# Patient Record
Sex: Male | Born: 1950 | Race: White | Hispanic: No | Marital: Married | Smoking: Never smoker
Health system: Southern US, Community
[De-identification: ages and names within clinical notes are randomized; demographics above are authoritative.]

## PROBLEM LIST (undated history)

## (undated) DIAGNOSIS — G4730 Sleep apnea, unspecified: Secondary | ICD-10-CM

## (undated) DIAGNOSIS — J309 Allergic rhinitis, unspecified: Secondary | ICD-10-CM

## (undated) DIAGNOSIS — E785 Hyperlipidemia, unspecified: Secondary | ICD-10-CM

## (undated) DIAGNOSIS — D369 Benign neoplasm, unspecified site: Secondary | ICD-10-CM

## (undated) DIAGNOSIS — N35919 Unspecified urethral stricture, male, unspecified site: Secondary | ICD-10-CM

## (undated) DIAGNOSIS — K219 Gastro-esophageal reflux disease without esophagitis: Secondary | ICD-10-CM

## (undated) DIAGNOSIS — I10 Essential (primary) hypertension: Secondary | ICD-10-CM

## (undated) DIAGNOSIS — E349 Endocrine disorder, unspecified: Secondary | ICD-10-CM

## (undated) DIAGNOSIS — E559 Vitamin D deficiency, unspecified: Secondary | ICD-10-CM

## (undated) HISTORY — PX: OTHER SURGICAL HISTORY: SHX169

## (undated) HISTORY — PX: URETHRAL STRICTURE DILATATION: SHX477

---

## 2007-03-21 ENCOUNTER — Ambulatory Visit: Payer: Self-pay | Admitting: Gastroenterology

## 2007-11-22 ENCOUNTER — Ambulatory Visit: Payer: Self-pay | Admitting: Gastroenterology

## 2009-02-18 ENCOUNTER — Ambulatory Visit: Payer: Self-pay | Admitting: Gastroenterology

## 2010-03-31 ENCOUNTER — Ambulatory Visit: Payer: Self-pay | Admitting: Otolaryngology

## 2011-05-05 ENCOUNTER — Ambulatory Visit: Payer: Self-pay | Admitting: Gastroenterology

## 2011-05-08 ENCOUNTER — Emergency Department: Payer: Self-pay | Admitting: Unknown Physician Specialty

## 2011-05-22 ENCOUNTER — Emergency Department: Payer: Self-pay | Admitting: Unknown Physician Specialty

## 2012-04-19 ENCOUNTER — Other Ambulatory Visit: Payer: Self-pay | Admitting: Gastroenterology

## 2012-04-19 LAB — CLOSTRIDIUM DIFFICILE BY PCR

## 2012-06-27 ENCOUNTER — Ambulatory Visit: Payer: Self-pay | Admitting: Gastroenterology

## 2012-12-08 IMAGING — CT CT HEAD WITHOUT CONTRAST
2 series · 16 of 30 positions shown, 20 images · non-contrast
Comparison: none

REASON FOR EXAM: head injury
COMMENTS:

[Series 2: without · axial · non-contrast · 0.44mm/px · z∈[+333,+463]mm · 13 of 32 slices shown, 17 images]
[im 3/32  brain]
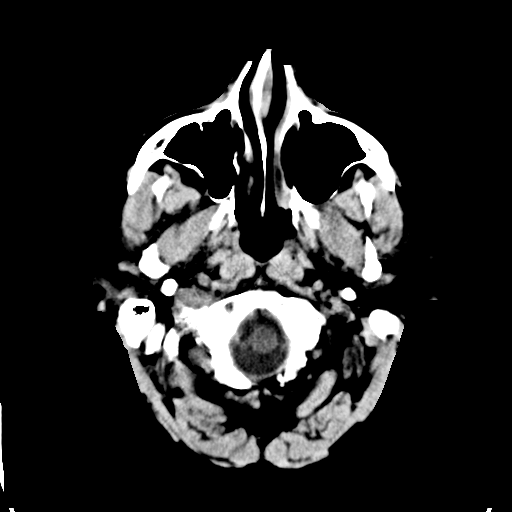
[im 3/32  bone]
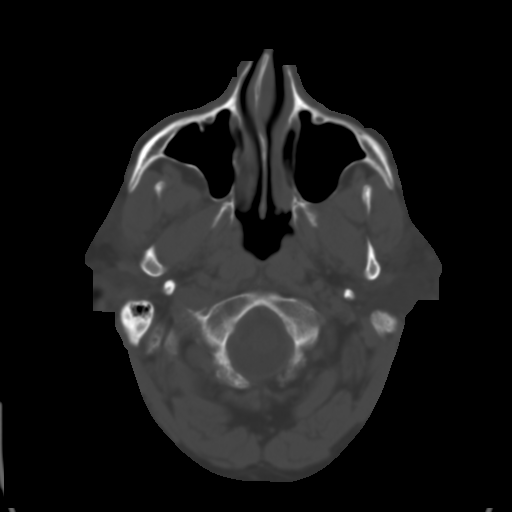
[im 5/32  brain]
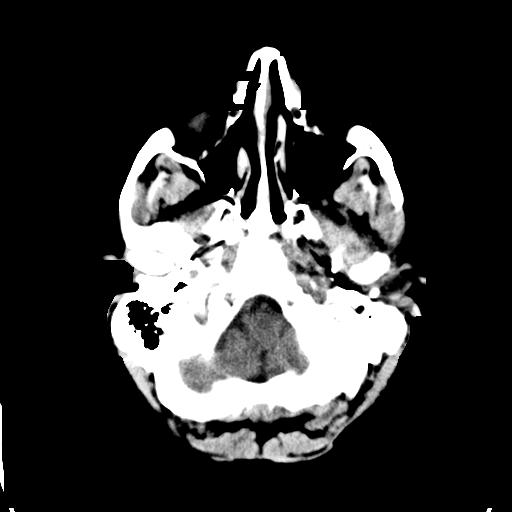
[im 7/32  brain]
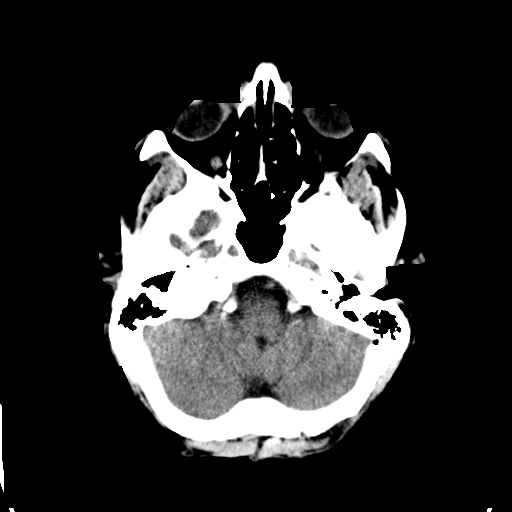
[im 9/32  brain]
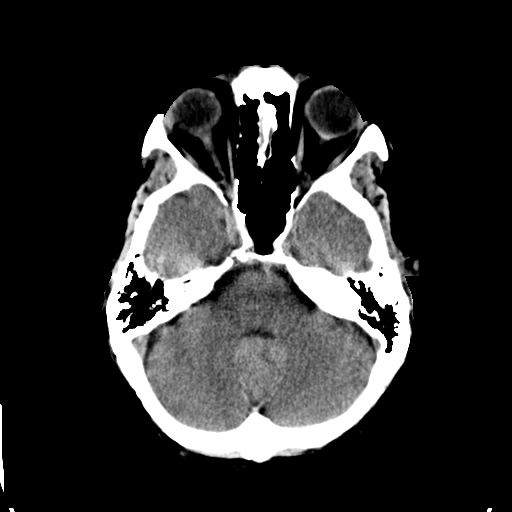
[im 12/32  brain]
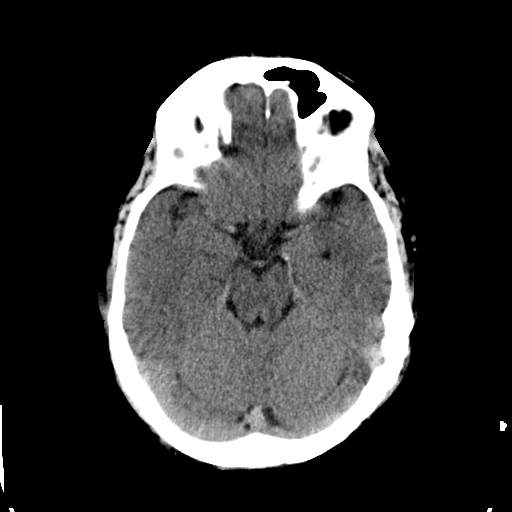
[im 12/32  bone]
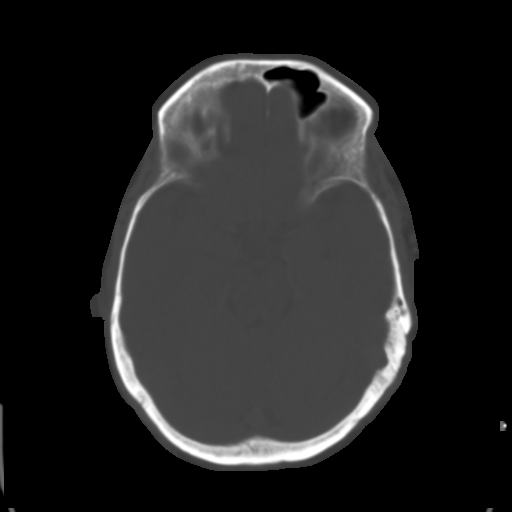
[im 14/32  brain]
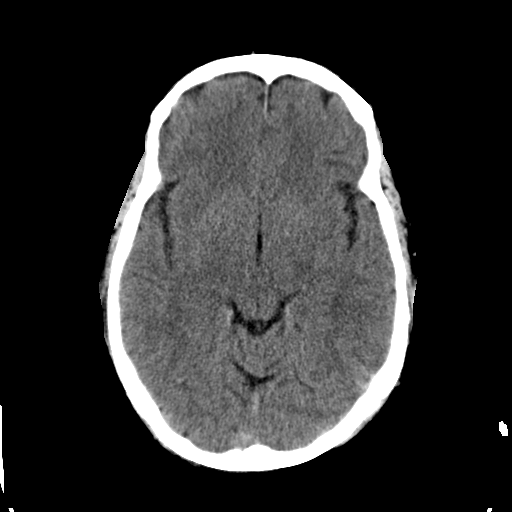
[im 16/32  brain]
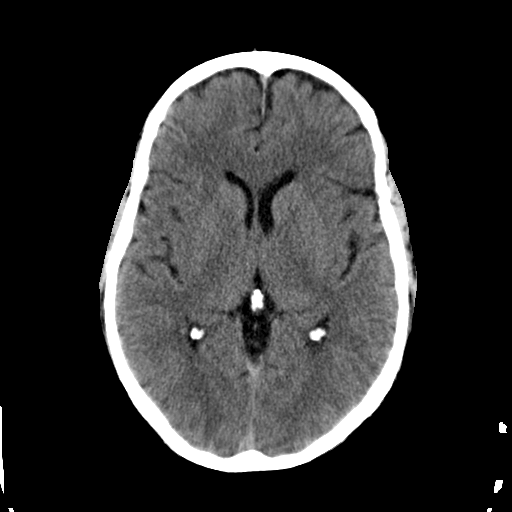
[im 18/32  brain]
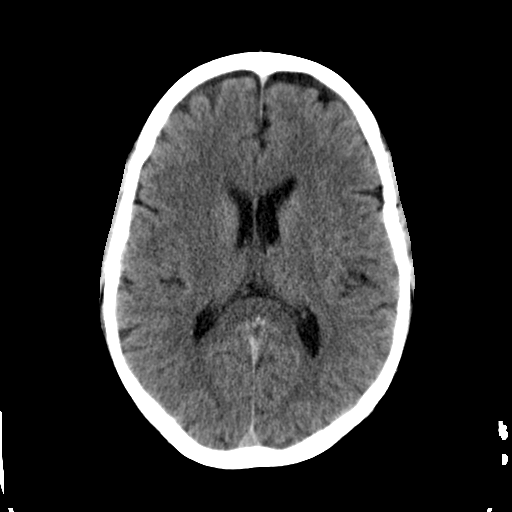
[im 20/32  brain]
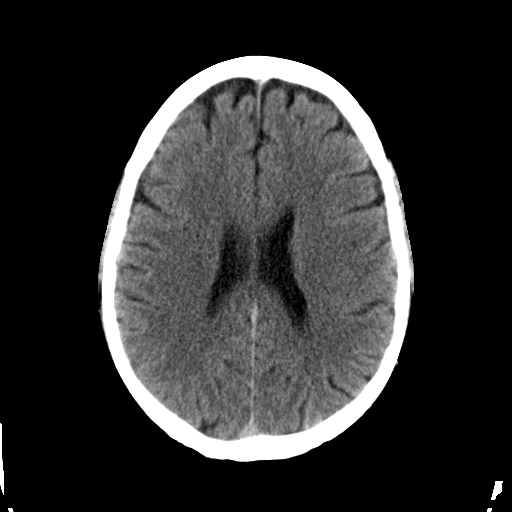
[im 20/32  bone]
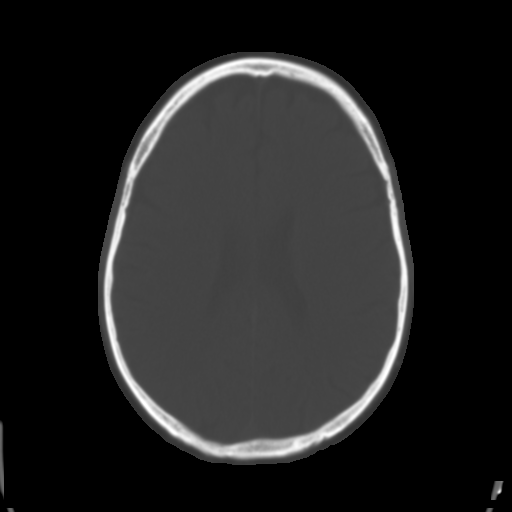
[im 23/32  brain]
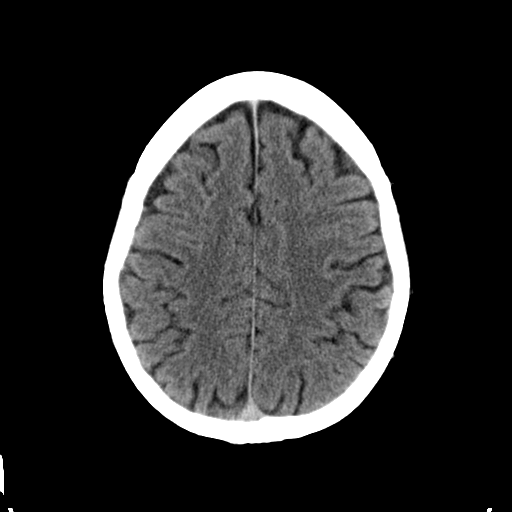
[im 25/32  brain]
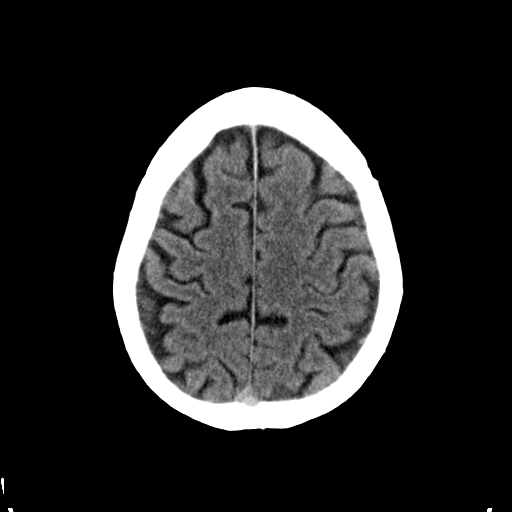
[im 27/32  brain]
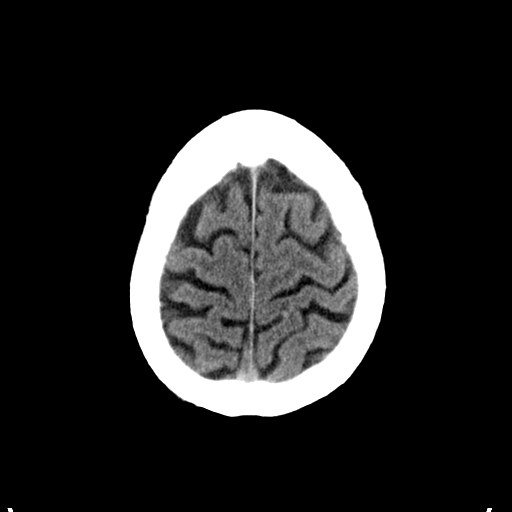
[im 29/32  brain]
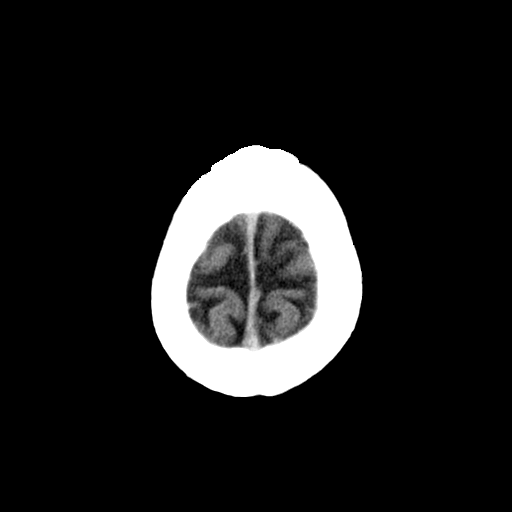
[im 29/32  bone]
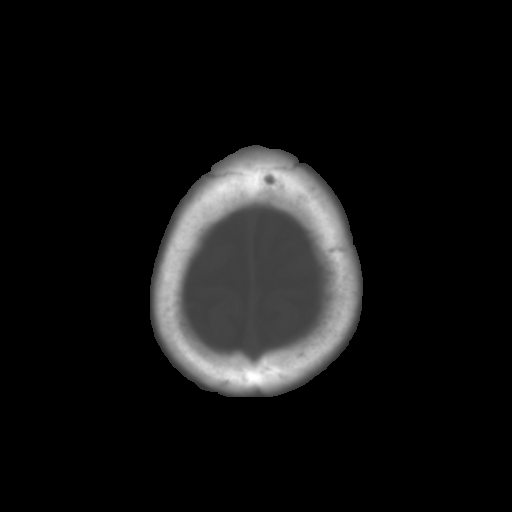

[Series 3: bone · axial · 0.44mm/px · z∈[+333,+378]mm · 3 of 32 slices shown]
[im 3/32  bone]
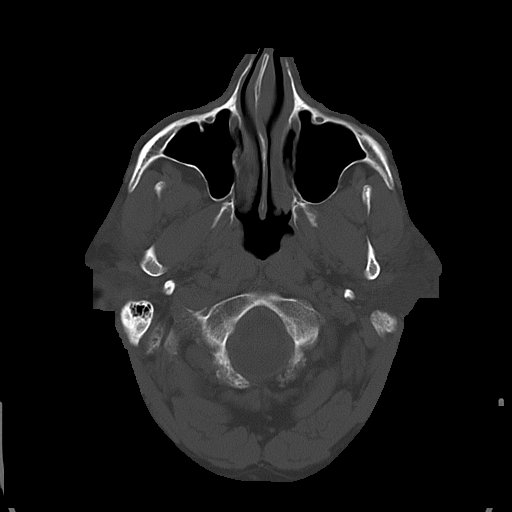
[im 7/32  bone]
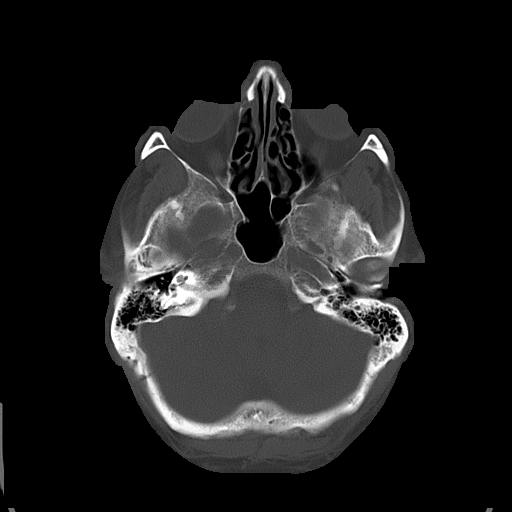
[im 12/32  bone]
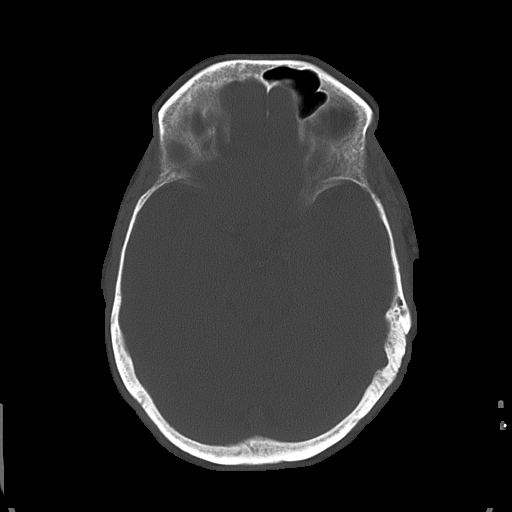

[16 of 30 positions shown; findings below may reference images not displayed]

PROCEDURE:     CT  - CT HEAD WITHOUT CONTRAST  - May 08, 2011  [DATE]

RESULT:     Noncontrast emergent CT of the brain is performed in the
standard fashion. There is no previous exam for comparison.

The ventricles and sulci are normal. There is no hemorrhage. There is no
focal mass, mass-effect or midline shift. There is no evidence of edema or
territorial infarct. The bone windows demonstrate normal aeration of the
paranasal sinuses and mastoid air cells. There is no skull fracture
demonstrated.
IMPRESSION: 1. No acute intracranial abnormality.

## 2015-02-11 ENCOUNTER — Other Ambulatory Visit: Payer: Self-pay | Admitting: Gastroenterology

## 2015-02-11 DIAGNOSIS — R1032 Left lower quadrant pain: Secondary | ICD-10-CM

## 2015-02-11 DIAGNOSIS — R197 Diarrhea, unspecified: Secondary | ICD-10-CM

## 2015-02-11 DIAGNOSIS — R1031 Right lower quadrant pain: Secondary | ICD-10-CM

## 2015-02-17 ENCOUNTER — Ambulatory Visit
Admission: RE | Admit: 2015-02-17 | Discharge: 2015-02-17 | Disposition: A | Discharge status: Home / Self Care | Payer: 59 | Source: Ambulatory Visit | Attending: Gastroenterology | Admitting: Gastroenterology

## 2015-02-17 DIAGNOSIS — K580 Irritable bowel syndrome with diarrhea: Secondary | ICD-10-CM | POA: Insufficient documentation

## 2015-02-17 DIAGNOSIS — R1031 Right lower quadrant pain: Secondary | ICD-10-CM | POA: Insufficient documentation

## 2015-02-17 DIAGNOSIS — Z86010 Personal history of colonic polyps: Secondary | ICD-10-CM | POA: Insufficient documentation

## 2015-02-17 DIAGNOSIS — R197 Diarrhea, unspecified: Secondary | ICD-10-CM

## 2015-02-17 DIAGNOSIS — R1032 Left lower quadrant pain: Secondary | ICD-10-CM | POA: Diagnosis present

## 2015-02-17 HISTORY — DX: Essential (primary) hypertension: I10

## 2015-02-17 MED ORDER — IOHEXOL 300 MG/ML  SOLN
100.0000 mL | Freq: Once | INTRAMUSCULAR | Status: AC | PRN
  Administered 2015-02-17: 100 mL via INTRAVENOUS

## 2015-03-17 ENCOUNTER — Encounter: Payer: Self-pay | Admitting: *Deleted

## 2015-03-18 ENCOUNTER — Encounter
Admission: RE | Disposition: A | Discharge status: Home Health | Payer: Self-pay | Source: Ambulatory Visit | Attending: Gastroenterology

## 2015-03-18 ENCOUNTER — Ambulatory Visit: Payer: 59 | Admitting: Anesthesiology

## 2015-03-18 ENCOUNTER — Ambulatory Visit
Admission: RE | Admit: 2015-03-18 | Discharge: 2015-03-18 | Disposition: A | Discharge status: Home Health | Payer: 59 | Source: Ambulatory Visit | Attending: Gastroenterology | Admitting: Gastroenterology

## 2015-03-18 ENCOUNTER — Encounter: Payer: Self-pay | Admitting: *Deleted

## 2015-03-18 DIAGNOSIS — G4730 Sleep apnea, unspecified: Secondary | ICD-10-CM | POA: Insufficient documentation

## 2015-03-18 DIAGNOSIS — E291 Testicular hypofunction: Secondary | ICD-10-CM | POA: Insufficient documentation

## 2015-03-18 DIAGNOSIS — D120 Benign neoplasm of cecum: Secondary | ICD-10-CM | POA: Diagnosis not present

## 2015-03-18 DIAGNOSIS — R197 Diarrhea, unspecified: Secondary | ICD-10-CM | POA: Diagnosis not present

## 2015-03-18 DIAGNOSIS — K648 Other hemorrhoids: Secondary | ICD-10-CM | POA: Diagnosis not present

## 2015-03-18 DIAGNOSIS — K529 Noninfective gastroenteritis and colitis, unspecified: Secondary | ICD-10-CM | POA: Diagnosis not present

## 2015-03-18 DIAGNOSIS — Z881 Allergy status to other antibiotic agents status: Secondary | ICD-10-CM | POA: Diagnosis not present

## 2015-03-18 DIAGNOSIS — K5730 Diverticulosis of large intestine without perforation or abscess without bleeding: Secondary | ICD-10-CM | POA: Diagnosis not present

## 2015-03-18 DIAGNOSIS — Z79899 Other long term (current) drug therapy: Secondary | ICD-10-CM | POA: Diagnosis not present

## 2015-03-18 DIAGNOSIS — K219 Gastro-esophageal reflux disease without esophagitis: Secondary | ICD-10-CM | POA: Diagnosis not present

## 2015-03-18 DIAGNOSIS — I10 Essential (primary) hypertension: Secondary | ICD-10-CM | POA: Insufficient documentation

## 2015-03-18 DIAGNOSIS — E559 Vitamin D deficiency, unspecified: Secondary | ICD-10-CM | POA: Insufficient documentation

## 2015-03-18 DIAGNOSIS — R1032 Left lower quadrant pain: Secondary | ICD-10-CM | POA: Diagnosis present

## 2015-03-18 DIAGNOSIS — E785 Hyperlipidemia, unspecified: Secondary | ICD-10-CM | POA: Insufficient documentation

## 2015-03-18 HISTORY — DX: Unspecified urethral stricture, male, unspecified site: N35.919

## 2015-03-18 HISTORY — DX: Sleep apnea, unspecified: G47.30

## 2015-03-18 HISTORY — DX: Benign neoplasm, unspecified site: D36.9

## 2015-03-18 HISTORY — DX: Endocrine disorder, unspecified: E34.9

## 2015-03-18 HISTORY — DX: Gastro-esophageal reflux disease without esophagitis: K21.9

## 2015-03-18 HISTORY — PX: COLONOSCOPY WITH PROPOFOL: SHX5780

## 2015-03-18 HISTORY — DX: Vitamin D deficiency, unspecified: E55.9

## 2015-03-18 HISTORY — DX: Hyperlipidemia, unspecified: E78.5

## 2015-03-18 HISTORY — DX: Allergic rhinitis, unspecified: J30.9

## 2015-03-18 SURGERY — COLONOSCOPY WITH PROPOFOL
Anesthesia: General

## 2015-03-18 MED ORDER — PROPOFOL INFUSION 10 MG/ML OPTIME
INTRAVENOUS | Status: DC | PRN
Start: 2015-03-18 — End: 2015-03-18
  Administered 2015-03-18: 140 ug/kg/min via INTRAVENOUS

## 2015-03-18 MED ORDER — SODIUM CHLORIDE 0.9 % IV SOLN
INTRAVENOUS | Status: DC
Start: 2015-03-18 — End: 2015-03-18
  Administered 2015-03-18: 15:00:00 via INTRAVENOUS

## 2015-03-18 MED ORDER — MIDAZOLAM HCL 2 MG/2ML IJ SOLN
INTRAMUSCULAR | Status: DC | PRN
  Administered 2015-03-18: 1 mg via INTRAVENOUS

## 2015-03-18 MED ORDER — FENTANYL CITRATE (PF) 100 MCG/2ML IJ SOLN
INTRAMUSCULAR | Status: DC | PRN
  Administered 2015-03-18: 50 ug via INTRAVENOUS

## 2015-03-18 NOTE — Anesthesia Preprocedure Evaluation (Signed)
Anesthesia Evaluation  Patient identified by MRN, date of birth, ID band Patient awake    Reviewed: Allergy & Precautions, H&P , NPO status , Patient's Chart, lab work & pertinent test results, reviewed documented beta blocker date and time   History of Anesthesia Complications Negative for: history of anesthetic complications  Airway Mallampati: III  TM Distance: >3 FB Neck ROM: full    Dental no notable dental hx. (+) Teeth Intact   Pulmonary sleep apnea (resolved after 45 pound weight loss) , neg COPDneg recent URI,  breath sounds clear to auscultation  Pulmonary exam normal       Cardiovascular Exercise Tolerance: Good hypertension, - angina- CAD, - Past MI, - Cardiac Stents and - CABG Normal cardiovascular exam- dysrhythmias - Valvular Problems/MurmursRhythm:regular Rate:Normal     Neuro/Psych negative neurological ROS  negative psych ROS   GI/Hepatic Neg liver ROS, GERD- (resolved after 45 pound weight loss)  ,  Endo/Other  negative endocrine ROS  Renal/GU negative Renal ROS  negative genitourinary   Musculoskeletal   Abdominal   Peds  Hematology negative hematology ROS (+)   Anesthesia Other Findings Past Medical History:   Hypertension                                                 GERD (gastroesophageal reflux disease)                       Hyperlipidemia                                               Benign neoplasm                                              Benign neoplasm                                              Urethral stricture                                           Sleep apnea                                                  Allergic rhinitis                                            Vitamin D deficiency                                         Hypotestosteronism  Reproductive/Obstetrics negative OB ROS                              Anesthesia Physical Anesthesia Plan  ASA: II  Anesthesia Plan: General   Post-op Pain Management:    Induction:   Airway Management Planned:   Additional Equipment:   Intra-op Plan:   Post-operative Plan:   Informed Consent: I have reviewed the patients History and Physical, chart, labs and discussed the procedure including the risks, benefits and alternatives for the proposed anesthesia with the patient or authorized representative who has indicated his/her understanding and acceptance.   Dental Advisory Given  Plan Discussed with: Anesthesiologist, CRNA and Surgeon  Anesthesia Plan Comments:         Anesthesia Quick Evaluation

## 2015-03-18 NOTE — H&P (Signed)
Outpatient short stay form Pre-procedure 03/18/2015 3:09 PM Lollie Sails MD  Primary Physician: Dr. Juluis Pitch  Reason for visit:  Lower abdominal pain and change of bowel habits with diarrhea  History of present illness:  Patient is a 64 year old male presenting several months of change of bowel habits with diarrhea. Stool studies as an outpatient have been uninformative. He does not have difficulty with Pasta or milk products. He tolerated his prep well. He takes no aspirin or blood thinners.    Current facility-administered medications:  .  0.9 %  sodium chloride infusion, , Intravenous, Continuous, Lollie Sails, MD  Prescriptions prior to admission  Medication Sig Dispense Refill Last Dose  . dicyclomine (BENTYL) 10 MG capsule Take 10 mg by mouth 4 (four) times daily -  before meals and at bedtime.   Past Week at Unknown time  . hyoscyamine (LEVSIN, ANASPAZ) 0.125 MG tablet Take 0.125 mg by mouth every 4 (four) hours as needed.   Past Week at Unknown time  . metoprolol succinate (TOPROL-XL) 50 MG 24 hr tablet Take 50 mg by mouth daily. Take with or immediately following a meal.   03/17/2015 at Unknown time     Allergies  Allergen Reactions  . Amoxicillin Other (See Comments)  . Erythromycin      Past Medical History  Diagnosis Date  . Hypertension   . GERD (gastroesophageal reflux disease)   . Hyperlipidemia   . Benign neoplasm   . Benign neoplasm   . Urethral stricture   . Sleep apnea   . Allergic rhinitis   . Vitamin D deficiency   . Hypotestosteronism     Review of systems:      Physical Exam    Heart and lungs: Regular rate and rhythm without rub or gallop, lungs are bilaterally clear    HEENT: Normocephalic atraumatic eyes are anicteric    Other:     Pertinant exam for procedure: Soft nontender nondistended bowel sounds positive normoactive    Planned proceedures: Colonoscopy and indicated procedures. I have discussed the risks benefits  and complications of procedures to include not limited to bleeding, infection, perforation and the risk of sedation and the patient wishes to proceed.    Lollie Sails, MD Gastroenterology 03/18/2015  3:09 PM

## 2015-03-18 NOTE — Transfer of Care (Signed)
Immediate Anesthesia Transfer of Care Note  Patient: Mark Bowman  Procedure(s) Performed: Procedure(s): COLONOSCOPY WITH PROPOFOL (N/A)  Patient Location: PACU and Endoscopy Unit  Anesthesia Type:General  Level of Consciousness: patient cooperative and lethargic  Airway & Oxygen Therapy: Patient Spontanous Breathing and Patient connected to nasal cannula oxygen  Post-op Assessment: Report given to RN and Post -op Vital signs reviewed and stable  Post vital signs: Reviewed and stable  Last Vitals:  Filed Vitals:   03/18/15 1600  BP: 110/57  Pulse:   Temp: 36.4 C  Resp: 16    Complications: No apparent anesthesia complications

## 2015-03-19 ENCOUNTER — Encounter: Payer: Self-pay | Admitting: Gastroenterology

## 2015-03-19 NOTE — Progress Notes (Signed)
Pt to call office about rash under arm. Dr Kayleen Memos aware.

## 2015-03-20 NOTE — Anesthesia Postprocedure Evaluation (Signed)
  Anesthesia Post-op Note  Patient: Mark Bowman  Procedure(s) Performed: Procedure(s): COLONOSCOPY WITH PROPOFOL (N/A)  Anesthesia type:General  Patient location: PACU  Post pain: Pain level controlled  Post assessment: Post-op Vital signs reviewed, Patient's Cardiovascular Status Stable, Respiratory Function Stable, Patent Airway and No signs of Nausea or vomiting  Post vital signs: Reviewed and stable  Last Vitals:  Filed Vitals:   03/18/15 1630  BP: 114/96  Pulse: 48  Temp:   Resp: 15    Level of consciousness: awake, alert  and patient cooperative  Complications: No apparent anesthesia complications

## 2015-03-20 NOTE — Op Note (Addendum)
[facility] Gastroenterology Patient Name: Mark Bowman Procedure Date: 03/18/2015 3:06 PM MRN: [id number] Account #: [id number] Date of Birth: 23-Jul-1951 Admit Type: Outpatient Age: 64 Room: [facility] ENDO ROOM 3 Gender: Male Note Status: Supervisor Override Procedure:         Colonoscopy Indications:       Abdominal pain in the left lower quadrant, Change in bowel                     habits, Clinically significant diarrhea of unexplained                     origin Providers:         Lollie Sails, MD Referring MD:      [name], MD (Referring MD) Medicines:         Monitored Anesthesia Care Complications:     No immediate complications. Procedure:         Pre-Anesthesia Assessment:                    - ASA Grade Assessment: II - A patient with mild systemic                     disease.                    After obtaining informed consent, the colonoscope was                     passed under direct vision. Throughout the procedure, the                     patient's blood pressure, pulse, and oxygen saturations                     were monitored continuously. The Colonoscope was                     introduced through the anus and advanced to the the                     terminal ileum. The colonoscopy was performed without                     difficulty. The patient tolerated the procedure well. The                     quality of the bowel preparation was fair. Findings:      A 2 mm polyp was found at the appendiceal orifice. The polyp was       sessile. The polyp was removed with a cold biopsy forceps. Resection and       retrieval were complete.      A patchy area of minimal erythematous mucosa was found in the distal       sigmoid colon.      Multiple small to medium diverticula were found in the sigmoid colon, in       the descending colon, in the transverse colon and in the ascending colon.      Biopsies for histology were taken with a cold  forceps from the right       colon, left colon and rectum for evaluation of microscopic colitis.      Non-bleeding internal hemorrhoids were found during retroflexion. The       hemorrhoids were  small.      The digital rectal exam was normal.      The terminal ileum appeared normal. Impression:        - One 2 mm polyp at the appendiceal orifice. Resected and                     retrieved.                    - Erythematous mucosa in the distal sigmoid colon.                    - Diverticulosis in the sigmoid colon, in the descending                     colon, in the transverse colon and in the ascending colon.                    - Non-bleeding internal hemorrhoids.                    - Biopsies were taken with a cold forceps from the right                     colon, left colon and rectum for evaluation of microscopic                     colitis. Recommendation:    - Await pathology results.                    - Return to GI clinic in 3 weeks.                    Hardin Negus colon health one capsule daily. Procedure Code(s): --- Professional ---                    [id number], Colonoscopy, flexible; with biopsy, single or                     multiple Diagnosis Code(s): --- Professional ---                    211.3, Benign neoplasm of colon                    569.89, Other specified disorders of intestine                    455.0, Internal hemorrhoids without mention of complication                    789.04, Abdominal pain, left lower quadrant                    787.99, Other symptoms involving digestive system                    787.91, Diarrhea                    562.10, Diverticulosis of colon (without mention of                     hemorrhage) CPT copyright 2014 American [employer] Association. All rights reserved. The codes documented in this report are preliminary and upon coder review may  be revised to meet current compliance requirements. Lollie Sails, MD 03/18/2015 4:00:58 PM This  report has been signed electronically. Number of Addenda: 0 Note Initiated On: 03/18/2015 3:06 PM Scope Withdrawal Time: 0 hours 13 minutes 32 seconds  Total Procedure Duration: 0 hours 23 minutes 9 seconds       [facility]

## 2015-03-24 LAB — SURGICAL PATHOLOGY

## 2015-05-07 ENCOUNTER — Other Ambulatory Visit
Admission: RE | Admit: 2015-05-07 | Discharge: 2015-05-07 | Disposition: A | Discharge status: Home / Self Care | Payer: 59 | Source: Ambulatory Visit | Attending: Gastroenterology | Admitting: Gastroenterology

## 2015-05-09 LAB — CELIAC DISEASE PANEL
ENDOMYSIAL ANTIBODY IGA: NEGATIVE
IGA: 106 mg/dL (ref 61–437)

## 2015-05-14 ENCOUNTER — Encounter: Payer: Self-pay | Admitting: Gastroenterology

## 2016-09-19 IMAGING — CT CT ABD-PELV W/ CM
2 of 5 series · 16 of 46 positions shown, 18 images · IV contrast (omnipaque)
Comparison: 11/22/2007

CLINICAL DATA: Subsequent encounter for chronic diarrhea now with
worsening over the last 6 months.

EXAM:
CT ABDOMEN AND PELVIS WITH CONTRAST
TECHNIQUE: Multidetector CT imaging of the abdomen and pelvis was performed
using the standard protocol following bolus administration of
intravenous contrast.
CONTRAST:  100mL OMNIPAQUE IOHEXOL 300 MG/ML  SOLN

[Series 2: routine with · axial · 0.79mm/px · z∈[-920,-490]mm · 13 of 98 slices shown, 15 images]
[im 6/98  soft-tissue]
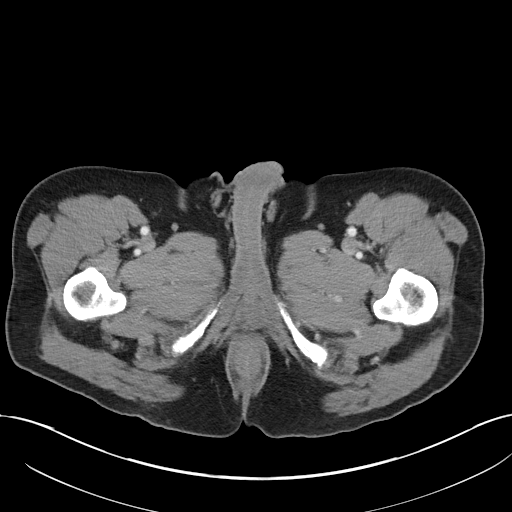
[im 6/98  bone]
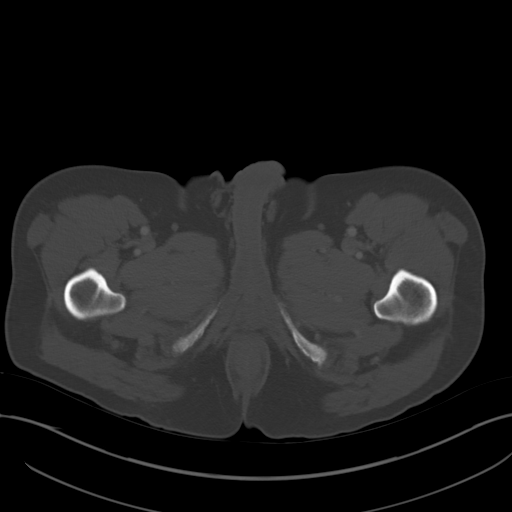
[im 16/98  soft-tissue]
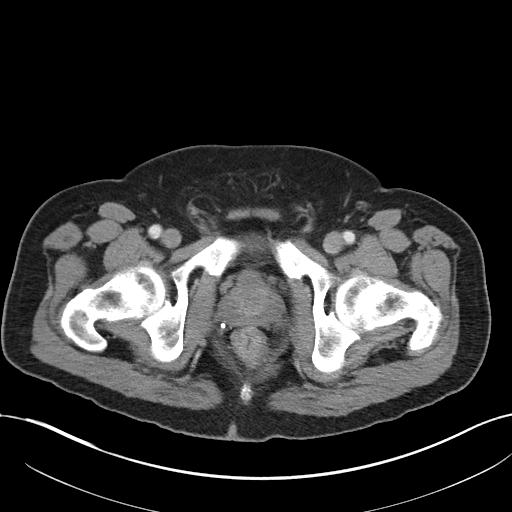
[im 21/98  soft-tissue]
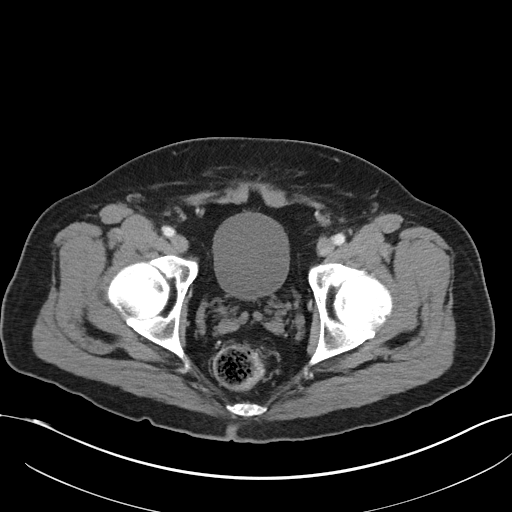
[im 26/98  soft-tissue]
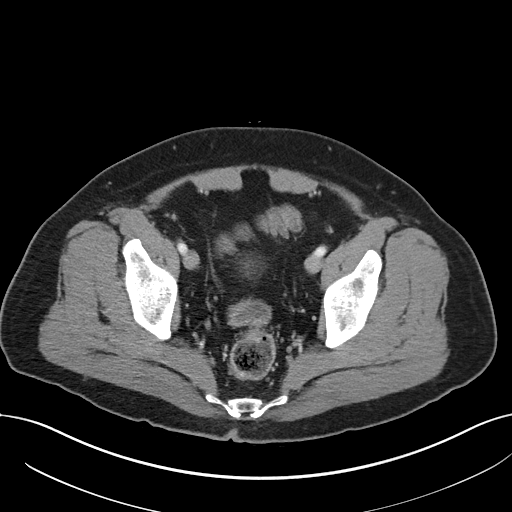
[im 36/98  soft-tissue]
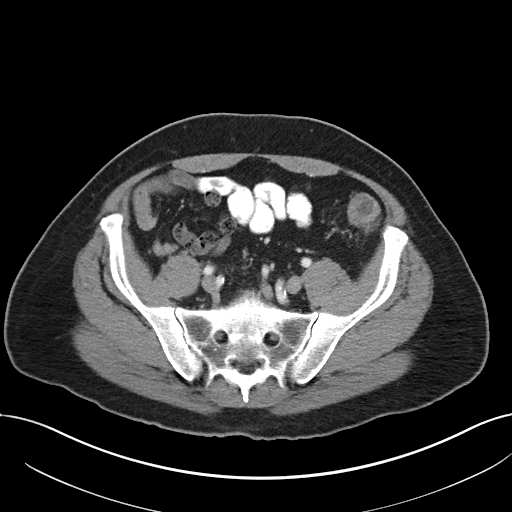
[im 41/98  soft-tissue]
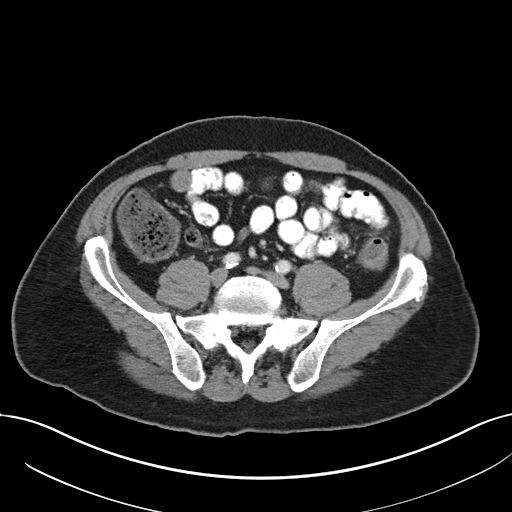
[im 52/98  soft-tissue]
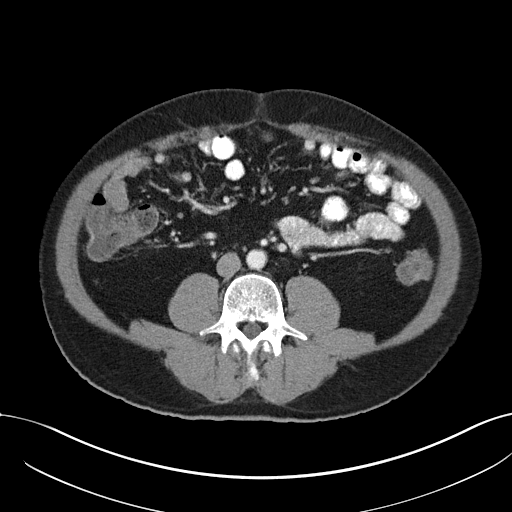
[im 57/98  soft-tissue]
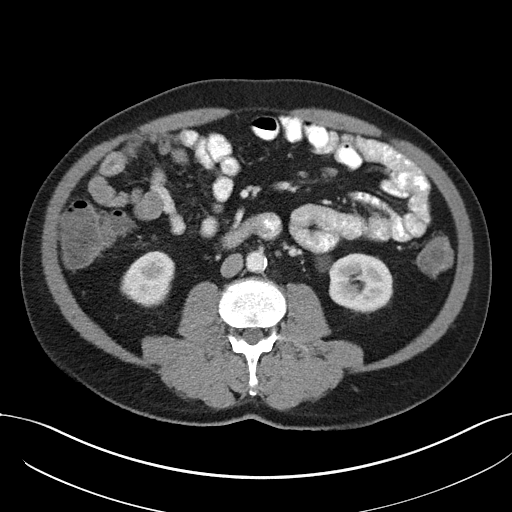
[im 62/98  soft-tissue]
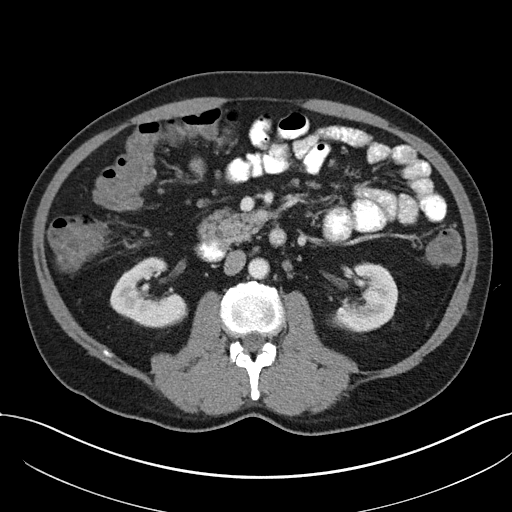
[im 62/98  bone]
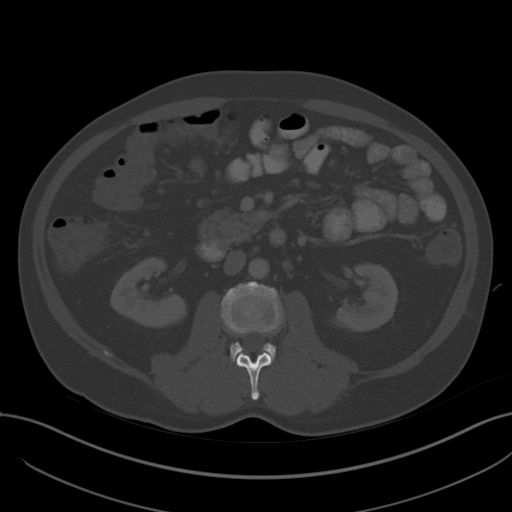
[im 72/98  soft-tissue]
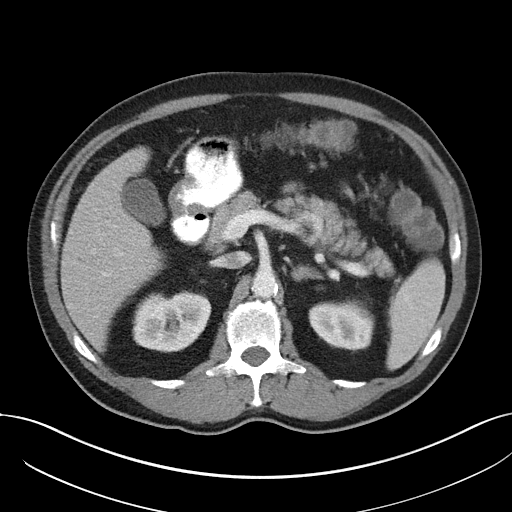
[im 77/98  soft-tissue]
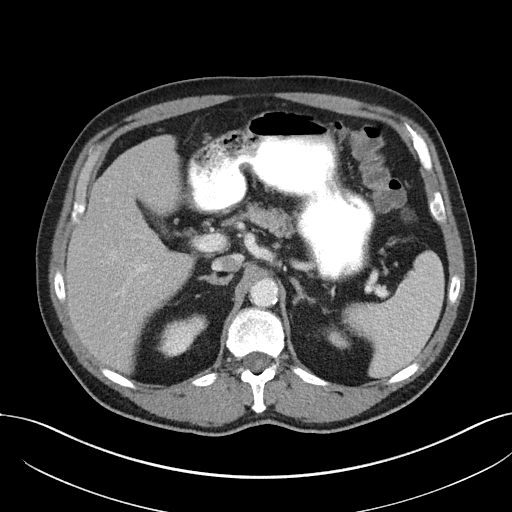
[im 82/98  soft-tissue]
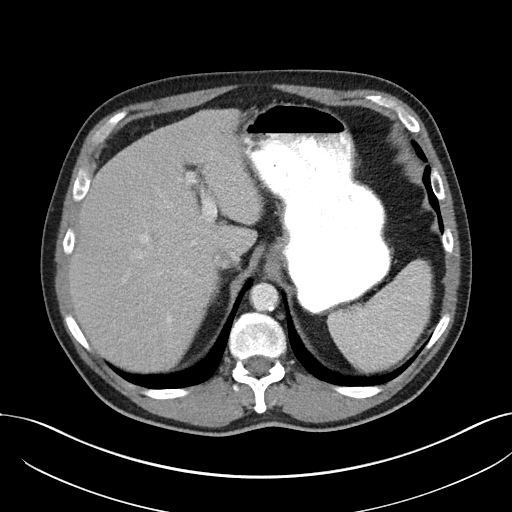
[im 92/98  soft-tissue]
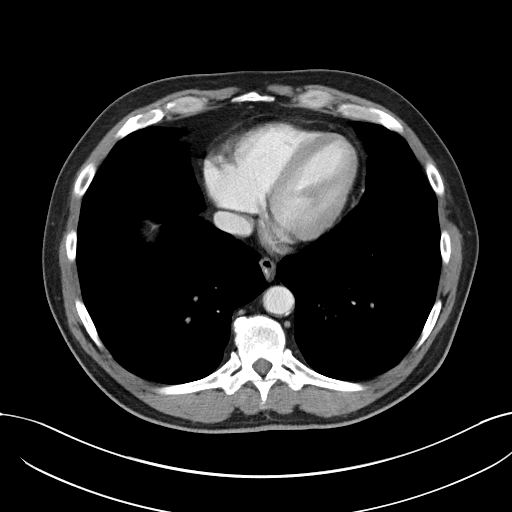

[Series 6: cor routine with · coronal · 0.77mm/px · 3 of 151 slices shown]
[im 51/151  soft-tissue]
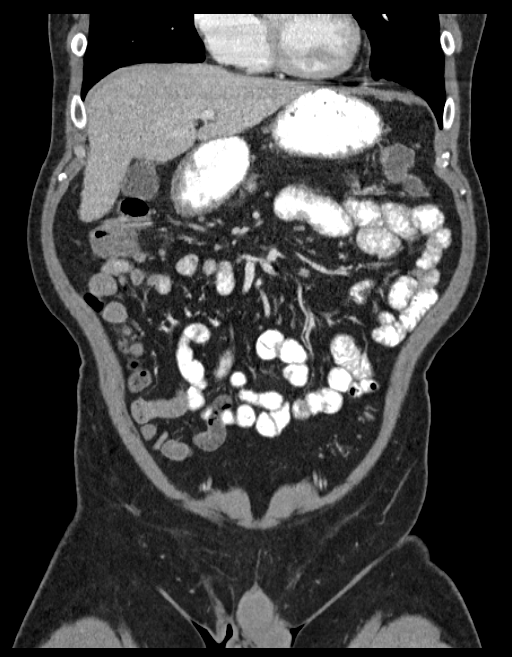
[im 67/151  soft-tissue]
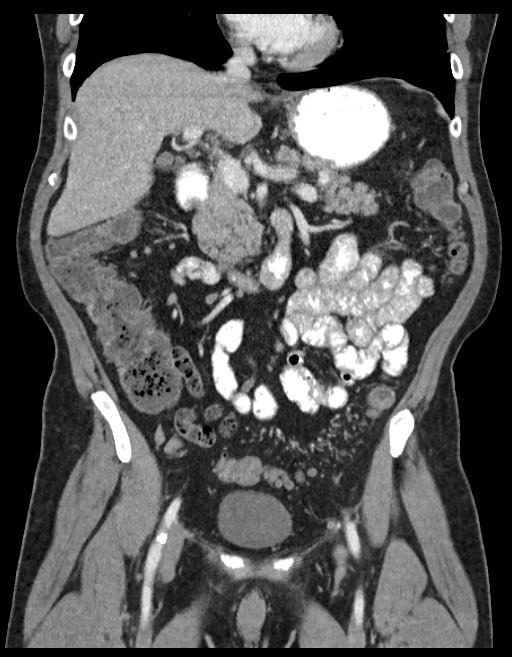
[im 84/151  soft-tissue]
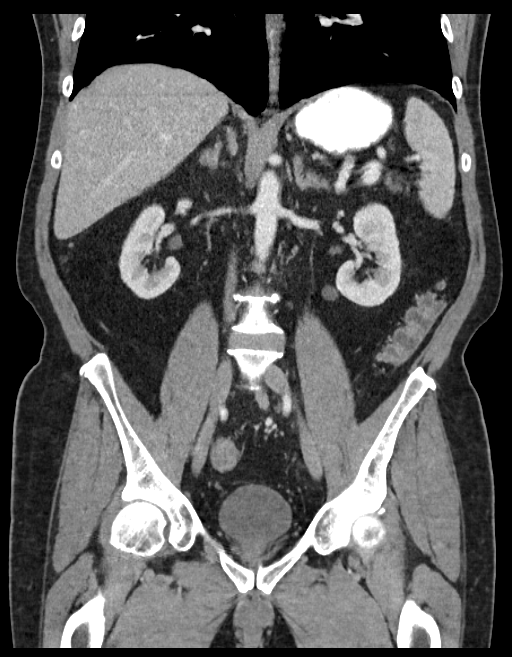

[16 of 46 positions shown; findings below may reference images not displayed]

FINDINGS: Lower chest:  Unremarkable.

Hepatobiliary: No focal abnormality within the liver parenchyma.
There is no evidence for gallstones, gallbladder wall thickening, or
pericholecystic fluid. No intrahepatic or extrahepatic biliary
dilation.

Pancreas: No focal mass lesion. No dilatation of the main duct. No
intraparenchymal cyst. No peripancreatic edema.

Spleen: No splenomegaly. No focal mass lesion.

Adrenals/Urinary Tract: No adrenal nodule or mass. Right kidney is
unremarkable. 2.2 cm exophytic water density lesion at the lower
pole of the left kidney is compatible with a cyst. No evidence for
hydroureteronephrosis urinary bladder is unremarkable.

Stomach/Bowel: Stomach is nondistended. No gastric wall thickening.
No evidence of outlet obstruction. Duodenum is normally positioned
as is the ligament of Treitz. No small bowel wall thickening. No
small bowel dilatation. Terminal ileum is normal the appendix is
normal. Colon has normal CT imaging features without wall thickening
or pericolonic edema/ inflammation. No substantial diverticular
disease.

Vascular/Lymphatic: There is abdominal aortic atherosclerosis
without aneurysm. No lymphadenopathy in the abdomen no evidence for
pelvic lymphadenopathy.

Reproductive: Prostate gland is enlarged. Seminal vesicles are
unremarkable.

Other: No intraperitoneal free fluid.

Musculoskeletal: Bone windows reveal no worrisome lytic or sclerotic
osseous lesions.
IMPRESSION: No acute findings in the abdomen or pelvis. Specifically, no
findings to explain the patient's history of chronic diarrhea with
recent worsening.

## 2016-11-04 ENCOUNTER — Encounter: Payer: 59 | Admitting: Physical Therapy

## 2016-11-08 ENCOUNTER — Ambulatory Visit: Payer: 59 | Admitting: Physical Therapy

## 2016-11-11 ENCOUNTER — Encounter: Payer: 59 | Admitting: Physical Therapy

## 2016-11-17 ENCOUNTER — Ambulatory Visit: Payer: 59

## 2016-11-22 ENCOUNTER — Encounter: Payer: 59 | Admitting: Physical Therapy

## 2016-11-25 ENCOUNTER — Encounter: Payer: 59 | Admitting: Physical Therapy

## 2016-11-29 ENCOUNTER — Encounter: Payer: 59 | Admitting: Physical Therapy

## 2017-11-18 DIAGNOSIS — H6123 Impacted cerumen, bilateral: Secondary | ICD-10-CM | POA: Diagnosis not present

## 2017-11-18 DIAGNOSIS — J301 Allergic rhinitis due to pollen: Secondary | ICD-10-CM | POA: Diagnosis not present

## 2017-11-25 DIAGNOSIS — K219 Gastro-esophageal reflux disease without esophagitis: Secondary | ICD-10-CM | POA: Diagnosis not present

## 2017-11-25 DIAGNOSIS — J301 Allergic rhinitis due to pollen: Secondary | ICD-10-CM | POA: Diagnosis not present

## 2017-12-26 DIAGNOSIS — L570 Actinic keratosis: Secondary | ICD-10-CM | POA: Diagnosis not present

## 2017-12-26 DIAGNOSIS — C44612 Basal cell carcinoma of skin of right upper limb, including shoulder: Secondary | ICD-10-CM | POA: Diagnosis not present

## 2017-12-26 DIAGNOSIS — D485 Neoplasm of uncertain behavior of skin: Secondary | ICD-10-CM | POA: Diagnosis not present

## 2017-12-26 DIAGNOSIS — D2362 Other benign neoplasm of skin of left upper limb, including shoulder: Secondary | ICD-10-CM | POA: Diagnosis not present

## 2017-12-26 DIAGNOSIS — L821 Other seborrheic keratosis: Secondary | ICD-10-CM | POA: Diagnosis not present

## 2017-12-26 DIAGNOSIS — X32XXXA Exposure to sunlight, initial encounter: Secondary | ICD-10-CM | POA: Diagnosis not present

## 2017-12-26 DIAGNOSIS — Z08 Encounter for follow-up examination after completed treatment for malignant neoplasm: Secondary | ICD-10-CM | POA: Diagnosis not present

## 2018-02-15 DIAGNOSIS — L239 Allergic contact dermatitis, unspecified cause: Secondary | ICD-10-CM | POA: Diagnosis not present

## 2018-02-15 DIAGNOSIS — C44612 Basal cell carcinoma of skin of right upper limb, including shoulder: Secondary | ICD-10-CM | POA: Diagnosis not present

## 2018-05-04 DIAGNOSIS — K625 Hemorrhage of anus and rectum: Secondary | ICD-10-CM | POA: Diagnosis not present

## 2018-05-04 DIAGNOSIS — R1031 Right lower quadrant pain: Secondary | ICD-10-CM | POA: Diagnosis not present

## 2018-05-04 DIAGNOSIS — R197 Diarrhea, unspecified: Secondary | ICD-10-CM | POA: Diagnosis not present

## 2018-05-05 DIAGNOSIS — I10 Essential (primary) hypertension: Secondary | ICD-10-CM | POA: Diagnosis not present

## 2018-05-05 DIAGNOSIS — Z125 Encounter for screening for malignant neoplasm of prostate: Secondary | ICD-10-CM | POA: Diagnosis not present

## 2018-05-05 DIAGNOSIS — K625 Hemorrhage of anus and rectum: Secondary | ICD-10-CM | POA: Diagnosis not present

## 2018-05-05 DIAGNOSIS — E785 Hyperlipidemia, unspecified: Secondary | ICD-10-CM | POA: Diagnosis not present

## 2018-05-09 ENCOUNTER — Other Ambulatory Visit
Admission: RE | Admit: 2018-05-09 | Discharge: 2018-05-09 | Disposition: A | Discharge status: Home / Self Care | Payer: 59 | Source: Ambulatory Visit | Attending: Gastroenterology | Admitting: Gastroenterology

## 2018-05-09 DIAGNOSIS — K625 Hemorrhage of anus and rectum: Secondary | ICD-10-CM | POA: Diagnosis present

## 2018-05-09 LAB — GASTROINTESTINAL PANEL BY PCR, STOOL (REPLACES STOOL CULTURE)
ADENOVIRUS F40/41: NOT DETECTED
ASTROVIRUS: NOT DETECTED
Campylobacter species: NOT DETECTED
Cryptosporidium: NOT DETECTED
Cyclospora cayetanensis: NOT DETECTED
ENTAMOEBA HISTOLYTICA: NOT DETECTED
ENTEROPATHOGENIC E COLI (EPEC): NOT DETECTED
ENTEROTOXIGENIC E COLI (ETEC): NOT DETECTED
Enteroaggregative E coli (EAEC): NOT DETECTED
Giardia lamblia: NOT DETECTED
Norovirus GI/GII: NOT DETECTED
Plesimonas shigelloides: NOT DETECTED
Rotavirus A: NOT DETECTED
SAPOVIRUS (I, II, IV, AND V): NOT DETECTED
SHIGA LIKE TOXIN PRODUCING E COLI (STEC): NOT DETECTED
Salmonella species: NOT DETECTED
Shigella/Enteroinvasive E coli (EIEC): NOT DETECTED
VIBRIO CHOLERAE: NOT DETECTED
Vibrio species: NOT DETECTED
Yersinia enterocolitica: NOT DETECTED

## 2018-05-09 LAB — C DIFFICILE QUICK SCREEN W PCR REFLEX
C Diff antigen: NEGATIVE
C Diff interpretation: NOT DETECTED
C Diff toxin: NEGATIVE

## 2018-05-10 LAB — CALPROTECTIN, FECAL: CALPROTECTIN, FECAL: 140 ug/g — AB (ref 0–120)

## 2018-05-12 DIAGNOSIS — Z0000 Encounter for general adult medical examination without abnormal findings: Secondary | ICD-10-CM | POA: Diagnosis not present

## 2018-05-12 DIAGNOSIS — Z23 Encounter for immunization: Secondary | ICD-10-CM | POA: Diagnosis not present

## 2018-05-18 DIAGNOSIS — Z85828 Personal history of other malignant neoplasm of skin: Secondary | ICD-10-CM | POA: Diagnosis not present

## 2018-05-18 DIAGNOSIS — D2262 Melanocytic nevi of left upper limb, including shoulder: Secondary | ICD-10-CM | POA: Diagnosis not present

## 2018-05-18 DIAGNOSIS — D2271 Melanocytic nevi of right lower limb, including hip: Secondary | ICD-10-CM | POA: Diagnosis not present

## 2018-06-26 DIAGNOSIS — I10 Essential (primary) hypertension: Secondary | ICD-10-CM | POA: Diagnosis not present

## 2018-07-04 ENCOUNTER — Ambulatory Visit: Admission: RE | Admit: 2018-07-04 | Payer: 59 | Source: Ambulatory Visit | Admitting: Gastroenterology

## 2018-07-04 ENCOUNTER — Encounter: Admission: RE | Payer: Self-pay | Source: Ambulatory Visit

## 2018-07-04 SURGERY — COLONOSCOPY WITH PROPOFOL
Anesthesia: General

## 2018-08-28 ENCOUNTER — Encounter: Payer: Self-pay | Admitting: *Deleted

## 2018-08-29 ENCOUNTER — Ambulatory Visit
Admission: RE | Admit: 2018-08-29 | Discharge: 2018-08-29 | Disposition: A | Discharge status: Home / Self Care | Payer: 59 | Attending: Gastroenterology | Admitting: Gastroenterology

## 2018-08-29 ENCOUNTER — Encounter
Admission: RE | Disposition: A | Discharge status: Home / Self Care | Payer: Self-pay | Source: Home / Self Care | Attending: Gastroenterology

## 2018-08-29 ENCOUNTER — Ambulatory Visit: Payer: 59 | Admitting: Anesthesiology

## 2018-08-29 ENCOUNTER — Encounter: Payer: Self-pay | Admitting: Anesthesiology

## 2018-08-29 DIAGNOSIS — E559 Vitamin D deficiency, unspecified: Secondary | ICD-10-CM | POA: Insufficient documentation

## 2018-08-29 DIAGNOSIS — K5730 Diverticulosis of large intestine without perforation or abscess without bleeding: Secondary | ICD-10-CM | POA: Insufficient documentation

## 2018-08-29 DIAGNOSIS — K648 Other hemorrhoids: Secondary | ICD-10-CM | POA: Insufficient documentation

## 2018-08-29 DIAGNOSIS — N35919 Unspecified urethral stricture, male, unspecified site: Secondary | ICD-10-CM | POA: Insufficient documentation

## 2018-08-29 DIAGNOSIS — Z79899 Other long term (current) drug therapy: Secondary | ICD-10-CM | POA: Diagnosis not present

## 2018-08-29 DIAGNOSIS — I10 Essential (primary) hypertension: Secondary | ICD-10-CM | POA: Insufficient documentation

## 2018-08-29 DIAGNOSIS — K644 Residual hemorrhoidal skin tags: Secondary | ICD-10-CM | POA: Diagnosis not present

## 2018-08-29 DIAGNOSIS — K5790 Diverticulosis of intestine, part unspecified, without perforation or abscess without bleeding: Secondary | ICD-10-CM | POA: Diagnosis not present

## 2018-08-29 DIAGNOSIS — E785 Hyperlipidemia, unspecified: Secondary | ICD-10-CM | POA: Diagnosis not present

## 2018-08-29 DIAGNOSIS — K529 Noninfective gastroenteritis and colitis, unspecified: Secondary | ICD-10-CM | POA: Insufficient documentation

## 2018-08-29 DIAGNOSIS — K625 Hemorrhage of anus and rectum: Secondary | ICD-10-CM | POA: Diagnosis not present

## 2018-08-29 DIAGNOSIS — G4730 Sleep apnea, unspecified: Secondary | ICD-10-CM | POA: Insufficient documentation

## 2018-08-29 DIAGNOSIS — D124 Benign neoplasm of descending colon: Secondary | ICD-10-CM | POA: Diagnosis not present

## 2018-08-29 DIAGNOSIS — K5289 Other specified noninfective gastroenteritis and colitis: Secondary | ICD-10-CM | POA: Diagnosis not present

## 2018-08-29 DIAGNOSIS — Z881 Allergy status to other antibiotic agents status: Secondary | ICD-10-CM | POA: Insufficient documentation

## 2018-08-29 DIAGNOSIS — K219 Gastro-esophageal reflux disease without esophagitis: Secondary | ICD-10-CM | POA: Diagnosis not present

## 2018-08-29 HISTORY — PX: COLONOSCOPY WITH PROPOFOL: SHX5780

## 2018-08-29 HISTORY — DX: Hyperlipidemia, unspecified: E78.5

## 2018-08-29 SURGERY — COLONOSCOPY WITH PROPOFOL
Anesthesia: General

## 2018-08-29 MED ORDER — FENTANYL CITRATE (PF) 100 MCG/2ML IJ SOLN
INTRAMUSCULAR | Status: AC
  Filled 2018-08-29: qty 2

## 2018-08-29 MED ORDER — PROPOFOL 500 MG/50ML IV EMUL
INTRAVENOUS | Status: AC
  Filled 2018-08-29: qty 50

## 2018-08-29 MED ORDER — PROPOFOL 500 MG/50ML IV EMUL
INTRAVENOUS | Status: DC | PRN
  Administered 2018-08-29: 180 ug/kg/min via INTRAVENOUS

## 2018-08-29 MED ORDER — FENTANYL CITRATE (PF) 100 MCG/2ML IJ SOLN
INTRAMUSCULAR | Status: DC | PRN
  Administered 2018-08-29: 50 ug via INTRAVENOUS

## 2018-08-29 MED ORDER — SODIUM CHLORIDE 0.9 % IV SOLN
INTRAVENOUS | Status: DC
  Administered 2018-08-29: 1000 mL via INTRAVENOUS
  Administered 2018-08-29: 13:00:00 via INTRAVENOUS

## 2018-08-29 MED ORDER — LIDOCAINE HCL (PF) 2 % IJ SOLN
INTRAMUSCULAR | Status: AC
  Filled 2018-08-29: qty 10

## 2018-08-29 MED ORDER — MIDAZOLAM HCL 2 MG/2ML IJ SOLN
INTRAMUSCULAR | Status: DC | PRN
  Administered 2018-08-29: 1 mg via INTRAVENOUS

## 2018-08-29 MED ORDER — MIDAZOLAM HCL 2 MG/2ML IJ SOLN
INTRAMUSCULAR | Status: AC
  Filled 2018-08-29: qty 2

## 2018-08-29 MED ORDER — PROPOFOL 10 MG/ML IV BOLUS
INTRAVENOUS | Status: DC | PRN
  Administered 2018-08-29: 50 mg via INTRAVENOUS

## 2018-08-29 NOTE — Anesthesia Procedure Notes (Signed)
Date/Time: 08/29/2018 1:00 PM Performed by: Allean Found, CRNA Pre-anesthesia Checklist: Patient identified, Emergency Drugs available, Suction available, Patient being monitored and Timeout performed Patient Re-evaluated:Patient Re-evaluated prior to induction Oxygen Delivery Method: Nasal cannula Placement Confirmation: positive ETCO2

## 2018-08-29 NOTE — Anesthesia Preprocedure Evaluation (Signed)
Anesthesia Evaluation  Patient identified by MRN, date of birth, ID band Patient awake    Reviewed: Allergy & Precautions, NPO status , Patient's Chart, lab work & pertinent test results, reviewed documented beta blocker date and time   Airway Mallampati: II  TM Distance: >3 FB     Dental  (+) Chipped   Pulmonary sleep apnea ,           Cardiovascular hypertension, Pt. on medications and Pt. on home beta blockers      Neuro/Psych    GI/Hepatic GERD  ,  Endo/Other    Renal/GU      Musculoskeletal   Abdominal   Peds  Hematology   Anesthesia Other Findings Lost weight, does not use CPAP anymore.  Reproductive/Obstetrics                             Anesthesia Physical Anesthesia Plan  ASA: III  Anesthesia Plan: General   Post-op Pain Management:    Induction: Intravenous  PONV Risk Score and Plan:   Airway Management Planned:   Additional Equipment:   Intra-op Plan:   Post-operative Plan:   Informed Consent: I have reviewed the patients History and Physical, chart, labs and discussed the procedure including the risks, benefits and alternatives for the proposed anesthesia with the patient or authorized representative who has indicated his/her understanding and acceptance.       Plan Discussed with: CRNA  Anesthesia Plan Comments:         Anesthesia Quick Evaluation

## 2018-08-29 NOTE — Anesthesia Post-op Follow-up Note (Signed)
Anesthesia QCDR form completed.        

## 2018-08-29 NOTE — Op Note (Signed)
[facility] Gastroenterology Patient Name: Mark Bowman Procedure Date: 08/29/2018 12:55 PM MRN: [id number] Account #: [id number] Date of Birth: 1950-11-08 Admit Type: Outpatient Age: 68 Room: [facility] ENDO ROOM 2 Gender: Male Note Status: Finalized Procedure:            Colonoscopy Indications:          Clinically significant diarrhea of unexplained origin,                        Rectal bleeding, Change in bowel habits Providers:            Lollie Sails, MD Referring MD:         [name], MD (Referring MD) Medicines:            Monitored Anesthesia Care Complications:        No immediate complications. Procedure:            Pre-Anesthesia Assessment:                       - ASA Grade Assessment: III - A patient with severe                        systemic disease.                       After obtaining informed consent, the colonoscope was                        passed under direct vision. Throughout the procedure,                        the patient's blood pressure, pulse, and oxygen                        saturations were monitored continuously. The                        Colonoscope was introduced through the anus and                        advanced to the the terminal ileum. The colonoscopy was                        performed without difficulty. The patient tolerated the                        procedure well. The quality of the bowel preparation                        was good. Findings:      A few medium-mouthed diverticula were found in the sigmoid colon,       descending colon, transverse colon and ascending colon.      Patchy mild inflammation characterized by congestion (edema) and       erythema was found from 20 to 40 cm proximal to the anus. Biopsies were       taken with a cold forceps for histology.      A 2 mm polyp was found in the distal descending colon. The polyp was       sessile. The polyp was removed with a cold biopsy forceps.  Resection and       retrieval were complete.      The exam was otherwise normal throughout the examined colon.      Biopsies for histology were taken with a cold forceps from the cecum,       ascending colon, transverse colon, descending colon, sigmoid       colon(non-affected areas) and rectum (mild irritation noted) for       evaluation of microscopic colitis, placed in separate jars labeled with       location.      The retroflexed view of the distal rectum and anal verge was normal and       showed no anal or rectal abnormalities.      Hemorrhoids were found on perianal exam.      The terminal ileum appeared normal. Biopsies were taken with a cold       forceps for histology.      The digital rectal exam was normal otherwise. Impression:           - Diverticulosis in the sigmoid colon, in the                        descending colon, in the transverse colon and in the                        ascending colon.                       - Patchy mild inflammation was found from 20 to 40 cm                        proximal to the anus secondary to colitis. Biopsied.                       - One 2 mm polyp in the distal descending colon,                        removed with a cold biopsy forceps. Resected and                        retrieved.                       - The distal rectum and anal verge are normal on                        retroflexion view.                       - Hemorrhoids found on perianal exam.                       - Biopsies were taken with a cold forceps from the                        cecum, ascending colon, transverse colon, descending                        colon, sigmoid colon and rectum for evaluation of                        microscopic colitis. Recommendation:       -  Soft diet today, then advance as tolerated to advance                        diet as tolerated. Procedure Code(s):    --- Professional ---                       [phone], Colonoscopy, flexible; with  biopsy, single or                        multiple Diagnosis Code(s):    --- Professional ---                       K64.9, Unspecified hemorrhoids                       K52.9, Noninfective gastroenteritis and colitis,                        unspecified                       D12.4, Benign neoplasm of descending colon                       R19.7, Diarrhea, unspecified                       K62.5, Hemorrhage of anus and rectum                       R19.4, Change in bowel habit                       K57.30, Diverticulosis of large intestine without                        perforation or abscess without bleeding CPT copyright 2018 American Medical Association. All rights reserved. The codes documented in this report are preliminary and upon coder review may  be revised to meet current compliance requirements. Lollie Sails, MD 08/29/2018 1:47:54 PM This report has been signed electronically. Number of Addenda: 0 Note Initiated On: 08/29/2018 12:55 PM Scope Withdrawal Time: 0 hours 26 minutes 13 seconds  Total Procedure Duration: 0 hours 33 minutes 7 seconds       [facility]

## 2018-08-29 NOTE — Anesthesia Postprocedure Evaluation (Signed)
Anesthesia Post Note  Patient: Mark Bowman  Procedure(s) Performed: COLONOSCOPY WITH PROPOFOL (N/A )  Patient location during evaluation: Endoscopy Anesthesia Type: General Level of consciousness: awake and alert and oriented Pain management: pain level controlled Vital Signs Assessment: post-procedure vital signs reviewed and stable Respiratory status: spontaneous breathing, nonlabored ventilation and respiratory function stable Cardiovascular status: blood pressure returned to baseline and stable Postop Assessment: no signs of nausea or vomiting Anesthetic complications: no     Last Vitals:  Vitals:   08/29/18 1348 08/29/18 1418  BP: 120/64 135/78  Pulse:    Resp:    Temp: (!) 36.2 C   SpO2:      Last Pain:  Vitals:   08/29/18 1408  TempSrc:   PainSc: 0-No pain                 [name]

## 2018-08-29 NOTE — H&P (Signed)
Outpatient short stay form Pre-procedure 08/29/2018 12:52 PM Lollie Sails MD  Primary Physician: Juluis Pitch, MD  Reason for visit: Colonoscopy  History of present illness: Patient is a 68 year old male presenting today for colonoscopy in regards to all habit changes.  He has several episodes a year may have several days of diarrhea lower abdominal discomfort.  This may last 3 to 4 days and then disappear.  He has had stool studies done that were negative.  Does have a primary family member that has inflammatory bowel disease.  His last flare started about 3 or 4 days ago.  He tolerated his prep well.  He takes no aspirin or blood thinning agent.  He rarely takes an NSAID.  Symptoms do not awaken him at night.  There may be some lower abdominal bloating when he has his "episode".  There seems to be no food triggers or activity triggers.    Current Facility-Administered Medications:  .  0.9 %  sodium chloride infusion, , Intravenous, Continuous, Lollie Sails, MD, Last Rate: 20 mL/hr at 08/29/18 1239, 1,000 mL at 08/29/18 1239  Medications Prior to Admission  Medication Sig Dispense Refill Last Dose  . amLODipine (NORVASC) 5 MG tablet Take 5 mg by mouth daily.     . metoprolol succinate (TOPROL-XL) 50 MG 24 hr tablet Take 50 mg by mouth daily. Take with or immediately following a meal.   08/28/2018 at Unknown time  . omeprazole (PRILOSEC) 20 MG capsule Take 20 mg by mouth daily.     . tadalafil (CIALIS) 10 MG tablet Take 10 mg by mouth daily as needed for erectile dysfunction.     Marland Kitchen zolpidem (AMBIEN) 10 MG tablet Take 10 mg by mouth at bedtime as needed for sleep.     . hyoscyamine (LEVSIN, ANASPAZ) 0.125 MG tablet Take 0.125 mg by mouth every 4 (four) hours as needed.   Past Week at Unknown time     Allergies  Allergen Reactions  . Amoxicillin Other (See Comments)  . Erythromycin      Past Medical History:  Diagnosis Date  . Allergic rhinitis   . Benign neoplasm   .  Benign neoplasm   . Elevated lipids   . GERD (gastroesophageal reflux disease)   . Hyperlipidemia   . Hypertension   . Hypotestosteronism   . Sleep apnea   . Urethral stricture   . Vitamin D deficiency     Review of systems:      Physical Exam    Heart and lungs: Regular rate and rhythm without rub or gallop, lungs are bilaterally clear.    HEENT: Normocephalic atraumatic eyes are anicteric    Other:    Pertinant exam for procedure: Soft nontender nondistended bowel sounds positive normoactive    Planned proceedures: Colonoscopy and indicated procedures. I have discussed the risks benefits and complications of procedures to include not limited to bleeding, infection, perforation and the risk of sedation and the patient wishes to proceed.    Lollie Sails, MD Gastroenterology 08/29/2018  12:52 PM

## 2018-08-29 NOTE — Transfer of Care (Signed)
Immediate Anesthesia Transfer of Care Note  Patient: Mark Bowman  Procedure(s) Performed: COLONOSCOPY WITH PROPOFOL (N/A )  Patient Location: PACU  Anesthesia Type:General  Level of Consciousness: sedated  Airway & Oxygen Therapy: Patient Spontanous Breathing and Patient connected to nasal cannula oxygen  Post-op Assessment: Report given to RN and Post -op Vital signs reviewed and stable  Post vital signs: Reviewed and stable  Last Vitals:  Vitals Value Taken Time  BP 120/64 08/29/2018  1:49 PM  Temp 36.2 C 08/29/2018  1:48 PM  Pulse 72 08/29/2018  1:50 PM  Resp 21 08/29/2018  1:50 PM  SpO2 98 % 08/29/2018  1:50 PM  Vitals shown include unvalidated device data.  Last Pain:  Vitals:   08/29/18 1348  TempSrc: Tympanic  PainSc:          Complications: No apparent anesthesia complications

## 2018-08-30 ENCOUNTER — Encounter: Payer: Self-pay | Admitting: Gastroenterology

## 2018-08-30 DIAGNOSIS — M7521 Bicipital tendinitis, right shoulder: Secondary | ICD-10-CM | POA: Diagnosis not present

## 2018-08-31 LAB — SURGICAL PATHOLOGY

## 2018-10-06 DIAGNOSIS — K580 Irritable bowel syndrome with diarrhea: Secondary | ICD-10-CM | POA: Diagnosis not present

## 2018-11-02 DIAGNOSIS — L708 Other acne: Secondary | ICD-10-CM | POA: Diagnosis not present

## 2018-11-15 DIAGNOSIS — E785 Hyperlipidemia, unspecified: Secondary | ICD-10-CM | POA: Diagnosis not present

## 2018-11-15 DIAGNOSIS — I10 Essential (primary) hypertension: Secondary | ICD-10-CM | POA: Diagnosis not present

## 2018-11-15 DIAGNOSIS — R1013 Epigastric pain: Secondary | ICD-10-CM | POA: Diagnosis not present

## 2018-11-22 DIAGNOSIS — I10 Essential (primary) hypertension: Secondary | ICD-10-CM | POA: Diagnosis not present

## 2018-11-22 DIAGNOSIS — Z0000 Encounter for general adult medical examination without abnormal findings: Secondary | ICD-10-CM | POA: Diagnosis not present

## 2020-05-09 ENCOUNTER — Other Ambulatory Visit: Admission: RE | Admit: 2020-05-09 | Payer: 59 | Source: Ambulatory Visit

## 2020-05-13 ENCOUNTER — Ambulatory Visit: Admission: RE | Admit: 2020-05-13 | Payer: 59 | Source: Home / Self Care

## 2020-05-13 ENCOUNTER — Encounter: Admission: RE | Payer: Self-pay | Source: Home / Self Care

## 2020-05-13 SURGERY — EGD (ESOPHAGOGASTRODUODENOSCOPY)
Anesthesia: General

## 2020-07-31 ENCOUNTER — Other Ambulatory Visit: Admission: RE | Admit: 2020-07-31 | Payer: 59 | Source: Ambulatory Visit

## 2020-08-04 ENCOUNTER — Ambulatory Visit: Admission: RE | Admit: 2020-08-04 | Payer: 59 | Source: Home / Self Care

## 2020-08-04 ENCOUNTER — Encounter: Admission: RE | Payer: Self-pay | Source: Home / Self Care

## 2020-08-04 SURGERY — ESOPHAGOGASTRODUODENOSCOPY (EGD) WITH PROPOFOL
Anesthesia: General

## 2022-11-04 ENCOUNTER — Other Ambulatory Visit: Payer: Self-pay | Admitting: Gastroenterology

## 2022-11-04 DIAGNOSIS — R1314 Dysphagia, pharyngoesophageal phase: Secondary | ICD-10-CM

## 2022-11-04 DIAGNOSIS — A09 Infectious gastroenteritis and colitis, unspecified: Secondary | ICD-10-CM

## 2022-11-09 ENCOUNTER — Ambulatory Visit
Admission: RE | Admit: 2022-11-09 | Discharge: 2022-11-09 | Disposition: A | Discharge status: Home / Self Care | Payer: Medicare Other | Source: Ambulatory Visit | Attending: Gastroenterology | Admitting: Gastroenterology

## 2022-11-09 DIAGNOSIS — R1314 Dysphagia, pharyngoesophageal phase: Secondary | ICD-10-CM | POA: Diagnosis present

## 2022-11-09 DIAGNOSIS — A09 Infectious gastroenteritis and colitis, unspecified: Secondary | ICD-10-CM | POA: Diagnosis present

## 2022-11-11 ENCOUNTER — Ambulatory Visit: Payer: Medicare Other

## 2022-11-11 DIAGNOSIS — K2960 Other gastritis without bleeding: Secondary | ICD-10-CM | POA: Diagnosis present

## 2022-11-11 DIAGNOSIS — K449 Diaphragmatic hernia without obstruction or gangrene: Secondary | ICD-10-CM | POA: Diagnosis not present

## 2022-11-11 DIAGNOSIS — K222 Esophageal obstruction: Secondary | ICD-10-CM | POA: Diagnosis not present

## 2022-11-19 ENCOUNTER — Ambulatory Visit: Payer: Medicare Other | Admitting: Urology

## 2022-11-19 ENCOUNTER — Encounter: Payer: Self-pay | Admitting: Urology

## 2022-11-19 VITALS — BP 152/80 | HR 71 | Ht 69.0 in | Wt 182.0 lb

## 2022-11-19 DIAGNOSIS — N529 Male erectile dysfunction, unspecified: Secondary | ICD-10-CM | POA: Diagnosis not present

## 2022-11-19 DIAGNOSIS — N401 Enlarged prostate with lower urinary tract symptoms: Secondary | ICD-10-CM | POA: Diagnosis not present

## 2022-11-19 LAB — URINALYSIS, COMPLETE
Bilirubin, UA: NEGATIVE
Glucose, UA: NEGATIVE
Ketones, UA: NEGATIVE
Leukocytes,UA: NEGATIVE
Nitrite, UA: NEGATIVE
Protein,UA: NEGATIVE
RBC, UA: NEGATIVE
Specific Gravity, UA: 1.02 (ref 1.005–1.030)
Urobilinogen, Ur: 1 mg/dL (ref 0.2–1.0)
pH, UA: 6.5 (ref 5.0–7.5)

## 2022-11-19 LAB — MICROSCOPIC EXAMINATION

## 2022-11-19 LAB — BLADDER SCAN AMB NON-IMAGING: Scan Result: 4

## 2022-11-19 NOTE — Progress Notes (Signed)
I, DeAsia L Maxie,acting as a scribe for Riki Altes, MD.,have documented all relevant documentation on the behalf of Riki Altes, MD,as directed by  Riki Altes, MD while in the presence of Riki Altes, MD.   I, Maysun L Gibbs,acting as a scribe for Riki Altes, MD.,have documented all relevant documentation on the behalf of Riki Altes, MD,as directed by  Riki Altes, MD while in the presence of Riki Altes, MD.   11/19/2022 10:14 AM   Mark Bowman 1951/05/04 [id number]  Referring provider: Dorothey Baseman, MD [address] [place],  [place] [address]  Chief Complaint  Patient presents with   Benign Prostatic Hypertrophy    HPI: Mark Bowman is a 72 y.o. male transferring urologic care after recent retirement of his previous urologist.  Previously followed by Dr. Evelene Croon for BPH and erectile dysfunction. UroLift 08/2020. Last saw Dr. Evelene Croon 01/13/22, at that visit he was taking Tamsulosin every other day and Tadalafil daily for erectile dysfunction. It was recommended at that visit to try and discontinue Tamsulosin completely and continue Tadalafil. Last PSA with PCP 11/10/2022 was 1.19. He was unable to discontinue the Tamsulosin and is presently taking it daily along with Tadalafil 5 mg daily. He is currently satisfied with his voiding pattern. Denies dysuria, gross hematuria Denies flank, abdominal or pelvic pain   PMH: Past Medical History:  Diagnosis Date   Allergic rhinitis    Benign neoplasm    Benign neoplasm    Elevated lipids    GERD (gastroesophageal reflux disease)    Hyperlipidemia    Hypertension    Hypotestosteronism    Sleep apnea    Urethral stricture    Vitamin D deficiency     Surgical History: Past Surgical History:  Procedure Laterality Date   COLONOSCOPY WITH PROPOFOL N/A 03/18/2015   Procedure: COLONOSCOPY WITH PROPOFOL;  Surgeon: Christena Deem, MD;  Location: [facility] ENDOSCOPY;  Service: Endoscopy;   Laterality: N/A;   COLONOSCOPY WITH PROPOFOL N/A 08/29/2018   Procedure: COLONOSCOPY WITH PROPOFOL;  Surgeon: Christena Deem, MD;  Location: [facility] ENDOSCOPY;  Service: Endoscopy;  Laterality: N/A;   fractured ankle surgery'     URETHRAL STRICTURE DILATATION      Home Medications:  Allergies as of 11/19/2022       Reactions   Amoxicillin Other (See Comments)   Erythromycin         Medication List        Accurate as of November 19, 2022 10:14 AM. If you have any questions, ask your nurse or doctor.          STOP taking these medications    hyoscyamine 0.125 MG tablet Commonly known as: LEVSIN Stopped by: Riki Altes, MD   omeprazole 20 MG capsule Commonly known as: PRILOSEC Stopped by: Riki Altes, MD   zolpidem 10 MG tablet Commonly known as: AMBIEN Stopped by: Riki Altes, MD       TAKE these medications    amLODipine 5 MG tablet Commonly known as: NORVASC Take 5 mg by mouth daily.   hydrochlorothiazide 12.5 MG tablet Commonly known as: HYDRODIURIL Take 1 tablet by mouth daily.   hydrocortisone 2.5 % cream Apply topically.   metoprolol succinate 50 MG 24 hr tablet Commonly known as: TOPROL-XL Take 50 mg by mouth daily. Take with or immediately following a meal.   Multi-Vitamin tablet Take 1 tablet by mouth daily.   PriLOSEC OTC 20 MG  tablet Generic drug: omeprazole Take by mouth.   rosuvastatin 20 MG tablet Commonly known as: CRESTOR Take 1 tablet by mouth daily.   tadalafil 10 MG tablet Commonly known as: CIALIS Take 10 mg by mouth daily as needed for erectile dysfunction.   tamsulosin 0.4 MG Caps capsule Commonly known as: FLOMAX Take by mouth.   Vitamin D-1000 Max St 25 MCG (1000 UT) tablet Generic drug: Cholecalciferol Take by mouth.        Allergies:  Allergies  Allergen Reactions   Amoxicillin Other (See Comments)   Erythromycin     Social History:  reports that he has never smoked. He has never used  smokeless tobacco. He reports current alcohol use of about 4.0 standard drinks of alcohol per week. He reports that he does not use drugs.   Physical Exam: BP (!) 152/80   Pulse 71   Ht 5\' 9"  (1.753 m)   Wt 182 lb (82.6 kg)   BMI 26.88 kg/m   Constitutional:  Alert and oriented, No acute distress. HEENT: [place] AT, moist mucus membranes.  Trachea midline, no masses. Cardiovascular: No clubbing, cyanosis, or edema. Respiratory: Normal respiratory effort, no increased work of breathing. GI: Abdomen is soft, nontender, nondistended, no abdominal masses GU: Prostate is 40 grams, smooth without nodules. Skin: No rashes, bruises or suspicious lesions. Neurologic: Grossly intact, no focal deficits, moving all 4 extremities. Psychiatric: Normal mood and affect.  Urinalysis dipstick/microscopy negative  Assessment & Plan:    BPH with LUTS PVR 4 mL Stable voiding symptoms on Tamsulosin/Tadalafil, and currently satisfied with his voiding pattern. Annual follow-up  Erectile dysfunction Stable on Tadalafil  [facility] [address] [place], [place] [address] [phone]

## 2022-11-19 NOTE — Patient Instructions (Signed)
Good rx

## 2023-02-10 ENCOUNTER — Telehealth: Payer: Self-pay | Admitting: Urology

## 2023-02-10 ENCOUNTER — Other Ambulatory Visit: Payer: Self-pay | Admitting: *Deleted

## 2023-02-10 MED ORDER — TADALAFIL 5 MG PO TABS: 5.0000 mg | ORAL_TABLET | Freq: Every day | ORAL | 2 refills | Status: DC

## 2023-02-10 NOTE — Telephone Encounter (Signed)
Patient is requesting refill for Tadalafil (prescription was originally from Dr. Evelene Croon) Patient states dosage is 5 mg. [facility] [place] [facility] [address]

## 2023-02-10 NOTE — Telephone Encounter (Signed)
Medication sent . 90 times 12 refills  for BPH

## 2023-11-13 ENCOUNTER — Other Ambulatory Visit: Payer: Self-pay | Admitting: Urology

## 2023-11-14 ENCOUNTER — Ambulatory Visit: Payer: Medicare Other | Admitting: Urology

## 2023-11-16 ENCOUNTER — Ambulatory Visit: Payer: Medicare Other | Admitting: Urology

## 2023-11-23 ENCOUNTER — Ambulatory Visit: Payer: Self-pay | Admitting: Urology

## 2023-11-23 ENCOUNTER — Encounter: Payer: Self-pay | Admitting: Urology

## 2023-11-23 VITALS — BP 143/75 | HR 71 | Ht 69.0 in | Wt 195.0 lb

## 2023-11-23 DIAGNOSIS — N5201 Erectile dysfunction due to arterial insufficiency: Secondary | ICD-10-CM | POA: Diagnosis not present

## 2023-11-23 DIAGNOSIS — N401 Enlarged prostate with lower urinary tract symptoms: Secondary | ICD-10-CM

## 2023-11-23 LAB — MICROSCOPIC EXAMINATION: Bacteria, UA: NONE SEEN

## 2023-11-23 LAB — URINALYSIS, COMPLETE
Bilirubin, UA: NEGATIVE
Glucose, UA: NEGATIVE
Ketones, UA: NEGATIVE
Leukocytes,UA: NEGATIVE
Nitrite, UA: NEGATIVE
Protein,UA: NEGATIVE
RBC, UA: NEGATIVE
Specific Gravity, UA: 1.025 (ref 1.005–1.030)
Urobilinogen, Ur: 0.2 mg/dL (ref 0.2–1.0)
pH, UA: 5 (ref 5.0–7.5)

## 2023-11-23 LAB — BLADDER SCAN AMB NON-IMAGING: Scan Result: 7

## 2023-11-23 MED ORDER — TAMSULOSIN HCL 0.4 MG PO CAPS: 0.4000 mg | ORAL_CAPSULE | Freq: Every day | ORAL | 3 refills | Status: DC

## 2023-11-23 NOTE — Progress Notes (Signed)
 I, Maysun Jamey Mccallum, acting as a scribe for Geraline Knapp, MD., have documented all relevant documentation on the behalf of Geraline Knapp, MD, as directed by Geraline Knapp, MD while in the presence of Geraline Knapp, MD.  11/23/2023 4:21 PM   Mark Bowman [birth year] [id number]  Referring provider: Rory Collard, MD [phone] [place],  [place] [address]  Chief Complaint  Patient presents with   Benign Prostatic Hypertrophy   Urologic history 1. BPH with LUTS -UroLift Dr. Fredrick Jenkins 08/2020.  -Tamsulosin  0.4 mg daily.   2. Erectile dysfunction -Tadalafil  5 mg daily.   HPI: Mark Bowman is a 73 y.o. male presents for annual follow-up.   No significant problem since last year's visit. Remains on tamsulosin  and tadalafil . Occasionally notes decreased force and caliber of his urinary stream PSA checked by PCP 11/14/23 stable at 1.16 Denies dysuria, gross hematuria Denies flank, abdominal, or pelvic pain.   PMH: Past Medical History:  Diagnosis Date   Allergic rhinitis    Benign neoplasm    Benign neoplasm    Elevated lipids    GERD (gastroesophageal reflux disease)    Hyperlipidemia    Hypertension    Hypotestosteronism    Sleep apnea    Urethral stricture    Vitamin D deficiency     Surgical History: Past Surgical History:  Procedure Laterality Date   COLONOSCOPY WITH PROPOFOL  N/A 03/18/2015   Procedure: COLONOSCOPY WITH PROPOFOL ;  Surgeon: Deveron Fly, MD;  Location[facility] ENDOSCOPY;  Service: Endoscopy;  Laterality: N/A;   COLONOSCOPY WITH PROPOFOL  N/A 08/29/2018   Procedure: COLONOSCOPY WITH PROPOFOL ;  Surgeon: Deveron Fly, MD;  Location: [facility] ENDOSCOPY;  Service: Endoscopy;  Laterality: N/A;   fractured ankle surgery'     URETHRAL STRICTURE DILATATION      Home Medications:  Allergies as of 11/23/2023       Reactions   Amoxicillin Other (See Comments)   Erythromycin         Medication List        Accurate as of November 23, 2023   4:21 PM. If you have any questions, ask your nurse or doctor.          amLODipine 5 MG tablet Commonly known as: NORVASC Take 5 mg by mouth daily.   cyanocobalamin 1000 MCG tablet Commonly known as: VITAMIN B12 Take 1,000 mcg by mouth.   hydrochlorothiazide 12.5 MG tablet Commonly known as: HYDRODIURIL Take 1 tablet by mouth daily.   hydrocortisone 2.5 % cream Apply topically.   metoprolol succinate 50 MG 24 hr tablet Commonly known as: TOPROL-XL Take 50 mg by mouth daily. Take with or immediately following a meal.   Multi-Vitamin tablet Take 1 tablet by mouth daily.   PriLOSEC OTC 20 MG tablet Generic drug: omeprazole Take by mouth.   rosuvastatin 20 MG tablet Commonly known as: CRESTOR Take 1 tablet by mouth daily.   tadalafil  5 MG tablet Commonly known as: CIALIS  Take 1 tablet (5 mg total) by mouth daily.   tamsulosin  0.4 MG Caps capsule Commonly known as: FLOMAX  Take 1 capsule (0.4 mg total) by mouth daily after supper. What changed:  how much to take when to take this Changed by: Geraline Knapp   Vitamin D-1000 Max St 25 MCG (1000 UT) tablet Generic drug: Cholecalciferol Take by mouth.        Allergies:  Allergies  Allergen Reactions   Amoxicillin Other (See Comments)   Erythromycin  Social History:  reports that he has never smoked. He has never used smokeless tobacco. He reports current alcohol use of about 4.0 standard drinks of alcohol per week. He reports that he does not use drugs.   Physical Exam: BP (!) 143/75   Pulse 71   Ht 5\' 9"  (1.753 m)   Wt 195 lb (88.5 kg)   BMI 28.80 kg/m   Constitutional:  Alert and oriented, No acute distress. HEENT: [place] AT Respiratory: Normal respiratory effort, no increased work of breathing. Psychiatric: Normal mood and affect.   Assessment & Plan:    1. BPH with LUTS PVR 7 mL Continue tamsulosin /tadalafil . Tamsulosin  was refilled.  Continue annual follow-up  2. Erectile  dysfunction Stable on tadalafil .  I have reviewed the above documentation for accuracy and completeness, and I agree with the above.   Geraline Knapp, MD  [facility] [address] [place], [place] [address] [phone]

## 2023-11-29 ENCOUNTER — Telehealth: Payer: Self-pay | Admitting: Urology

## 2023-11-29 ENCOUNTER — Other Ambulatory Visit: Payer: Self-pay | Admitting: *Deleted

## 2023-11-29 MED ORDER — TADALAFIL 5 MG PO TABS: 5.0000 mg | ORAL_TABLET | Freq: Every day | ORAL | 2 refills | Status: AC

## 2023-11-29 MED ORDER — TAMSULOSIN HCL 0.4 MG PO CAPS: 0.4000 mg | ORAL_CAPSULE | Freq: Every day | ORAL | 3 refills | Status: DC

## 2023-11-29 NOTE — Telephone Encounter (Signed)
 Medication sent to both places

## 2023-11-29 NOTE — Telephone Encounter (Signed)
 His [facility]  [facility] [address] and needs to be transferred over to [facility] on [address]   He also wants all his meds to go to [facility] except his [facility]  [facility] [address].

## 2024-03-08 ENCOUNTER — Ambulatory Visit

## 2024-03-08 DIAGNOSIS — D123 Benign neoplasm of transverse colon: Secondary | ICD-10-CM | POA: Diagnosis not present

## 2024-03-08 DIAGNOSIS — Z09 Encounter for follow-up examination after completed treatment for conditions other than malignant neoplasm: Secondary | ICD-10-CM | POA: Diagnosis present

## 2024-03-08 DIAGNOSIS — K5730 Diverticulosis of large intestine without perforation or abscess without bleeding: Secondary | ICD-10-CM | POA: Diagnosis not present

## 2024-03-08 DIAGNOSIS — K641 Second degree hemorrhoids: Secondary | ICD-10-CM | POA: Diagnosis not present

## 2024-03-08 DIAGNOSIS — Z860101 Personal history of adenomatous and serrated colon polyps: Secondary | ICD-10-CM | POA: Diagnosis not present

## 2024-05-21 ENCOUNTER — Ambulatory Visit: Admitting: Urology

## 2024-05-25 ENCOUNTER — Encounter: Payer: Self-pay | Admitting: Urology

## 2024-05-25 ENCOUNTER — Ambulatory Visit (INDEPENDENT_AMBULATORY_CARE_PROVIDER_SITE_OTHER): Admitting: Urology

## 2024-05-25 VITALS — BP 136/79 | HR 66 | Ht 69.0 in | Wt 203.0 lb

## 2024-05-25 DIAGNOSIS — N5201 Erectile dysfunction due to arterial insufficiency: Secondary | ICD-10-CM | POA: Diagnosis not present

## 2024-05-25 DIAGNOSIS — N401 Enlarged prostate with lower urinary tract symptoms: Secondary | ICD-10-CM

## 2024-05-25 LAB — BLADDER SCAN AMB NON-IMAGING

## 2024-05-25 MED ORDER — TAMSULOSIN HCL 0.4 MG PO CAPS: 0.8000 mg | ORAL_CAPSULE | Freq: Every day | ORAL | 3 refills | Status: DC

## 2024-05-25 NOTE — Patient Instructions (Signed)

## 2024-05-25 NOTE — Progress Notes (Signed)
 05/25/2024 10:12 AM   Cordella SHAUNNA Fair 28-Mar-1951 [id number]  Referring provider: Glover Lenis, MD [phone] [place],  [place] [address]  Chief Complaint  Patient presents with   Benign Prostatic Hypertrophy   Urologic history  1. BPH with LUTS -UroLift Dr. Kassie 08/2020.  -Tamsulosin  0.4 mg daily.    2. Erectile dysfunction -Tadalafil  5 mg daily.    HPI: ASAHD CAN is a 73 y.o. male called for a follow-up appointment due to worsening lower urinary tract symptoms.  Since his last visit he has noted a slower urinary stream and urinary hesitancy No dysuria or gross hematuria PSA 05/17/2024 with PCP stable at 1.30 IPSS today 17/35; most bothersome symptoms intermittent and weak urinary stream, sensation incomplete emptying and nocturia x 3   PMH: Past Medical History:  Diagnosis Date   Allergic rhinitis    Benign neoplasm    Benign neoplasm    Elevated lipids    GERD (gastroesophageal reflux disease)    Hyperlipidemia    Hypertension    Hypotestosteronism    Sleep apnea    Urethral stricture    Vitamin D deficiency     Surgical History: Past Surgical History:  Procedure Laterality Date   COLONOSCOPY WITH PROPOFOL  N/A 03/18/2015   Procedure: COLONOSCOPY WITH PROPOFOL ;  Surgeon: Gladis RAYMOND Mariner, MD;  Location: [facility] ENDOSCOPY;  Service: Endoscopy;  Laterality: N/A;   COLONOSCOPY WITH PROPOFOL  N/A 08/29/2018   Procedure: COLONOSCOPY WITH PROPOFOL ;  Surgeon: Mariner Gladis RAYMOND, MD;  Location: [facility] ENDOSCOPY;  Service: Endoscopy;  Laterality: N/A;   fractured ankle surgery'     URETHRAL STRICTURE DILATATION      Home Medications:  Allergies as of 05/25/2024       Reactions   Amoxicillin Other (See Comments)   Erythromycin         Medication List        Accurate as of May 25, 2024 10:12 AM. If you have any questions, ask your nurse or doctor.          amLODipine 5 MG tablet Commonly known as: NORVASC Take 5 mg by mouth daily.    cyanocobalamin 1000 MCG tablet Commonly known as: VITAMIN B12 Take 1,000 mcg by mouth.   hydrochlorothiazide 12.5 MG tablet Commonly known as: HYDRODIURIL Take 1 tablet by mouth daily.   hydrocortisone 2.5 % cream Apply topically.   metoprolol succinate 50 MG 24 hr tablet Commonly known as: TOPROL-XL Take 50 mg by mouth daily. Take with or immediately following a meal.   Multi-Vitamin tablet Take 1 tablet by mouth daily.   PriLOSEC OTC 20 MG tablet Generic drug: omeprazole Take by mouth.   rosuvastatin 20 MG tablet Commonly known as: CRESTOR Take 1 tablet by mouth daily.   tadalafil  5 MG tablet Commonly known as: CIALIS  Take 1 tablet (5 mg total) by mouth daily.   tamsulosin  0.4 MG Caps capsule Commonly known as: FLOMAX  Take 1 capsule (0.4 mg total) by mouth daily after supper.   Vitamin D-1000 Max St 25 MCG (1000 UT) tablet Generic drug: Cholecalciferol Take by mouth.        Allergies:  Allergies  Allergen Reactions   Amoxicillin Other (See Comments)   Erythromycin     Family History: No family history on file.  Social History:  reports that he has never smoked. He has never used smokeless tobacco. He reports current alcohol use of about 4.0 standard drinks of alcohol per week. He reports that he does not use  drugs.   Physical Exam: BP 136/79 (BP Location: Left Arm, Patient Position: Sitting, Cuff Size: Large)   Pulse 66   Ht 5' 9 (1.753 m)   Wt 203 lb (92.1 kg)   SpO2 97%   BMI 29.98 kg/m   Constitutional:  Alert, No acute distress. HEENT: [place] AT Respiratory: Normal respiratory effort, no increased work of breathing. Psychiatric: Normal mood and affect.  Assessment & Plan:    1.  BPH with LUTS Worsening obstructive lower urinary tract symptoms PVR today 28 mL Increase tamsulosin  to 0.8 mg daily Cystoscopy tentatively scheduled 1 month if voiding symptoms do not improve with tamsulosin  titration   Glendia JAYSON Barba, MD  [facility] [address] [place], [place] [address] [phone]

## 2024-05-28 ENCOUNTER — Telehealth: Payer: Self-pay | Admitting: Urology

## 2024-05-28 MED ORDER — TAMSULOSIN HCL 0.4 MG PO CAPS: 0.8000 mg | ORAL_CAPSULE | Freq: Every day | ORAL | 3 refills | Status: AC

## 2024-05-28 NOTE — Addendum Note (Signed)
 Addended by: GENITA HARLENE CROME on: 05/28/2024 11:49 AM   Modules accepted: Orders

## 2024-05-28 NOTE — Telephone Encounter (Signed)
 Pt needs Flomax  sent to [facility].  [facility] .

## 2024-06-27 ENCOUNTER — Ambulatory Visit: Admitting: Urology

## 2024-06-27 ENCOUNTER — Encounter: Payer: Self-pay | Admitting: Urology

## 2024-06-27 VITALS — BP 141/81 | HR 58 | Ht 69.0 in | Wt 200.0 lb

## 2024-06-27 DIAGNOSIS — N401 Enlarged prostate with lower urinary tract symptoms: Secondary | ICD-10-CM | POA: Diagnosis not present

## 2024-06-27 LAB — URINALYSIS, COMPLETE
Glucose, UA: NEGATIVE
Ketones, UA: NEGATIVE
Leukocytes,UA: NEGATIVE
Nitrite, UA: NEGATIVE
Protein,UA: NEGATIVE
RBC, UA: NEGATIVE
Specific Gravity, UA: 1.03 (ref 1.005–1.030)
Urobilinogen, Ur: 0.2 mg/dL (ref 0.2–1.0)
pH, UA: 6 (ref 5.0–7.5)

## 2024-06-27 LAB — MICROSCOPIC EXAMINATION: RBC, Urine: NONE SEEN /HPF (ref 0–2)

## 2024-06-27 MED ORDER — DUTASTERIDE 0.5 MG PO CAPS: 0.5000 mg | ORAL_CAPSULE | Freq: Every day | ORAL | 3 refills | Status: AC

## 2024-06-27 NOTE — Progress Notes (Signed)
   06/27/24  CC:  Chief Complaint  Patient presents with   Cysto    HPI: Refer to my prior note 05/25/2024.  His voiding symptoms have improved with tamsulosin  titration  Blood pressure (!) 141/81, pulse (!) 58, height 5' 9 (1.753 m), weight 200 lb (90.7 kg). NED. A&Ox3.   No respiratory distress   Abd soft, NT, ND Normal phallus with bilateral descended testicles  Cystoscopy Procedure Note  Patient identification was confirmed, informed consent was obtained, and patient was prepped using Betadine solution.  Lidocaine  jelly was administered per urethral meatus.     Pre-Procedure: - Inspection reveals a normal caliber urethral meatus.  Procedure: The flexible cystoscope was introduced without difficulty - No urethral strictures/lesions are present. - Left lateral lobe extending across midline distal prostatic urethra; by lobar enlargement mid prostate and left lateral lobe extending across midline at the bladder neck  - Normal bladder neck - Bilateral ureteral orifices identified - Bladder mucosa  reveals no ulcers, tumors, or lesions - No bladder stones - No trabeculation  Retroflexion shows no intravesical median lobe or prostatic/bladder calcifications   Post-Procedure: - Patient tolerated the procedure well  Assessment/ Plan: BPH with LUTS Symptoms improved on tamsulosin  Additional outlet procedures were discussed including TURP, HoLEP.  Presently not interested in surgical management Discussed adding a 5-ARI which may improve symptoms and reduce prostatic growth in the future.  We discussed potential side effects of ED and post finasteride syndrome He desires to start a 5-ARI and Rx dutasteride  was sent to pharmacy 1-month follow-up recheck   Glendia JAYSON Barba, MD

## 2024-06-30 ENCOUNTER — Encounter: Payer: Self-pay | Admitting: Urology

## 2024-10-24 ENCOUNTER — Ambulatory Visit: Admitting: Urology

## 2024-12-25 ENCOUNTER — Ambulatory Visit: Admitting: Urology
